# Patient Record
Sex: Female | Born: 1960 | Race: White | Hispanic: No | Marital: Married | Smoking: Never smoker
Health system: Southern US, Community
[De-identification: ages and names within clinical notes are randomized; demographics above are authoritative.]

## PROBLEM LIST (undated history)

## (undated) DIAGNOSIS — M1990 Unspecified osteoarthritis, unspecified site: Secondary | ICD-10-CM

## (undated) HISTORY — PX: BLEPHAROPLASTY: SUR158

## (undated) HISTORY — PX: TUBAL LIGATION: SHX77

## (undated) HISTORY — PX: KNEE ARTHROSCOPY: SUR90

## (undated) HISTORY — PX: FOOT SURGERY: SHX648

---

## 2009-06-26 DIAGNOSIS — J309 Allergic rhinitis, unspecified: Secondary | ICD-10-CM | POA: Insufficient documentation

## 2010-05-24 ENCOUNTER — Ambulatory Visit: Payer: Self-pay | Admitting: Unknown Physician Specialty

## 2011-04-03 ENCOUNTER — Ambulatory Visit: Payer: Self-pay | Admitting: Cardiovascular Disease

## 2014-04-02 ENCOUNTER — Ambulatory Visit: Payer: Self-pay | Admitting: Family Medicine

## 2014-06-23 LAB — CBC AND DIFFERENTIAL
HCT: 37 % (ref 36–46)
HEMOGLOBIN: 12.6 g/dL (ref 12.0–16.0)
Platelets: 277 10*3/uL (ref 150–399)
WBC: 6.5 10^3/mL

## 2014-06-23 LAB — HEPATIC FUNCTION PANEL
ALT: 25 U/L (ref 7–35)
AST: 27 U/L (ref 13–35)
Alkaline Phosphatase: 74 U/L (ref 25–125)
BILIRUBIN, TOTAL: 0.3 mg/dL

## 2014-06-23 LAB — BASIC METABOLIC PANEL
BUN: 13 mg/dL (ref 4–21)
CREATININE: 0.8 mg/dL (ref <=1.1)
GLUCOSE: 101 mg/dL
POTASSIUM: 5 mmol/L (ref 3.4–5.3)
SODIUM: 140 mmol/L (ref 137–147)

## 2014-06-23 LAB — LIPID PANEL
Cholesterol: 192 mg/dL (ref 0–200)
HDL: 55 mg/dL (ref 35–70)
LDL Cholesterol: 121 mg/dL
LDl/HDL Ratio: 2.2
Triglycerides: 82 mg/dL (ref 40–160)

## 2014-06-23 LAB — TSH: TSH: 2.89 u[IU]/mL (ref <=5.90)

## 2014-08-13 DIAGNOSIS — M5137 Other intervertebral disc degeneration, lumbosacral region: Secondary | ICD-10-CM | POA: Insufficient documentation

## 2014-08-13 DIAGNOSIS — E661 Drug-induced obesity: Secondary | ICD-10-CM | POA: Insufficient documentation

## 2014-09-28 ENCOUNTER — Ambulatory Visit (INDEPENDENT_AMBULATORY_CARE_PROVIDER_SITE_OTHER): Payer: BC Managed Care – PPO | Admitting: Family Medicine

## 2014-09-28 ENCOUNTER — Encounter: Payer: Self-pay | Admitting: Family Medicine

## 2014-09-28 VITALS — BP 132/80 | HR 68 | Temp 98.1°F | Resp 16 | Ht 67.0 in | Wt 282.0 lb

## 2014-09-28 DIAGNOSIS — Z0000 Encounter for general adult medical examination without abnormal findings: Secondary | ICD-10-CM

## 2014-09-28 DIAGNOSIS — M25562 Pain in left knee: Secondary | ICD-10-CM | POA: Diagnosis not present

## 2014-09-28 LAB — POCT URINALYSIS DIPSTICK
BILIRUBIN UA: NEGATIVE
Glucose, UA: NEGATIVE
KETONES UA: NEGATIVE
Leukocytes, UA: NEGATIVE
Nitrite, UA: NEGATIVE
PH UA: 6
Protein, UA: NEGATIVE
RBC UA: NEGATIVE
Spec Grav, UA: 1.015
Urobilinogen, UA: 0.2

## 2014-09-28 NOTE — Progress Notes (Signed)
Patient: Ariel Doyle, Female    DOB: 03-Nov-1960, 54 y.o.   MRN: [id number] Visit Date: 09/28/2014  Today's Provider: Wilhemena Durie, MD   Chief Complaint  Patient presents with  . Annual Exam  She sees Gyn for Breast exam and Pap. She is UTD. Subjective:   Ariel Doyle is a 54 y.o. female who presents today for her Subsequent Annual Wellness Visit. She feels well. She reports she is not exercising because of a torn Meniscus of her left knee. She reports she is sleeping well. She sees GYN for yearly breast and Pap.  Review of Systems  Constitutional: Negative.   HENT: Negative.   Eyes: Negative.   Respiratory: Negative.   Cardiovascular: Negative.   Gastrointestinal: Negative.   Endocrine: Negative.   Genitourinary: Negative.   Musculoskeletal: Positive for arthralgias.       Ongoing knee issue--needs surgery.  Skin: Negative.   Allergic/Immunologic: Negative.   Neurological: Negative.   Hematological: Negative.   Psychiatric/Behavioral: Negative.     Patient Active Problem List   Diagnosis Date Noted  . DDD (degenerative disc disease), lumbosacral 08/13/2014  . 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 Allergic rhinitis 06/26/2009    History   Social History  . Marital Status: Married    Spouse Name: N/A  . Number of Children: N/A  . Years of Education: N/A   Occupational History  . Not on file.   Social History Main Topics  . Smoking status: Not on file  . Smokeless tobacco: Not on file  . Alcohol Use: Not on file  . Drug Use: Not on file  . Sexual Activity: Not on file   Other Topics Concern  . Not on file   Social History Narrative  . No narrative on file    Past Surgical History  Procedure Laterality Date  . Tubal ligation    . Foot surgery      x 3    Her family history includes Breast cancer in her mother; Diabetes in her father; Heart attack in her father.    No outpatient prescriptions prior to visit.   No  facility-administered medications prior to visit.    Allergies no known allergies  Patient Care Team: Jerrol Banana., MD as PCP - General (Family Medicine)  Objective:   Vitals: There were no vitals filed for this visit.  Physical Exam  Constitutional: She is oriented to person, place, and time. She appears well-developed and well-nourished.  Obese. Ht67",Wt 282lbs,BMI 44.16  HENT:  Head: Normocephalic and atraumatic.  Right Ear: External ear normal.  Left Ear: External ear normal.  Nose: Nose normal.  Mouth/Throat: Oropharynx is clear and moist.  Eyes: Conjunctivae and EOM are normal. Pupils are equal, round, and reactive to light.  Neck: Normal range of motion. Neck supple.  Cardiovascular: Normal rate, regular rhythm, normal heart sounds and intact distal pulses.   Pulmonary/Chest: Effort normal and breath sounds normal.  Abdominal: Soft. Bowel sounds are normal.  Neurological: She is alert and oriented to person, place, and time. She has normal reflexes.  Skin: Skin is warm and dry.  Psychiatric: She has a normal mood and affect. Her behavior is normal. Judgment and thought content normal.    Activities of Daily Living No flowsheet data found.  Fall Risk Assessment No flowsheet data found.   Depression Screen No flowsheet data found.  Cognitive Testing - 6-CIT    Year: 0 4 points  Month: 0 3 points  Memorize "Pia Mau,  50, [place]"  Time (within 1 hour:) 0 3 points  Count backwards from 20: 0 2 4 points  Name months of year: 0 2 4 points  Repeat Address: 0 2 4 6 8 10  points   Total Score: 0/28  Interpretation : Normal (0-7) Abnormal (8-28)    Assessment & Plan:     Annual Wellness Visit--patient has no bad habits. Diet and exercise discussed at length for her overall wellness.  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 Theda Sers today  Obesity.--BMI today is 44.16. Iron exercise discussed at length. Patient says she  cannot exercise because of her knee. We discussed nonweightbearing exercises that are possible. Follow-up 1 year  Reviewed patient's Family Medical History Reviewed and updated list of patient's medical providers Assessment of cognitive impairment was done Assessed patient's functional ability Established a written schedule for health screening [place] Completed and Reviewed  Exercise Activities and Dietary recommendations Goals    None      Immunization History  Administered Date(s) Administered  . Tdap 02/22/2010    Health Maintenance  Topic Date Due  . HIV Screening  10/06/1977  . PAP SMEAR  10/06/1980  . MAMMOGRAM  10/06/2012  . COLONOSCOPY  10/06/2012  . INFLUENZA VACCINE  11/16/2014  . TETANUS/TDAP  02/23/2020      Discussed health benefits of physical activity, and encouraged her to engage in regular exercise appropriate for her age and condition.    Ariel Aschoff MD [facility] 09/28/2014 9:07 AM  ------------------------------------------------------------------------------------------------------------

## 2014-09-29 ENCOUNTER — Telehealth: Payer: Self-pay | Admitting: Emergency Medicine

## 2014-09-29 LAB — CBC WITH DIFFERENTIAL/PLATELET
BASOS: 0 %
Basophils Absolute: 0 10*3/uL (ref 0.0–0.2)
EOS (ABSOLUTE): 0.2 10*3/uL (ref 0.0–0.4)
Eos: 3 %
HEMATOCRIT: 39.8 % (ref 34.0–46.6)
Hemoglobin: 12.9 g/dL (ref 11.1–15.9)
Immature Grans (Abs): 0 10*3/uL (ref 0.0–0.1)
Immature Granulocytes: 0 %
LYMPHS ABS: 2.5 10*3/uL (ref 0.7–3.1)
LYMPHS: 40 %
MCH: 25.5 pg — ABNORMAL LOW (ref 26.6–33.0)
MCHC: 32.4 g/dL (ref 31.5–35.7)
MCV: 79 fL (ref 79–97)
MONOCYTES: 9 %
Monocytes Absolute: 0.6 10*3/uL (ref 0.1–0.9)
NEUTROS ABS: 3 10*3/uL (ref 1.4–7.0)
Neutrophils: 48 %
Platelets: 300 10*3/uL (ref 150–379)
RBC: 5.06 x10E6/uL (ref 3.77–5.28)
RDW: 15.3 % (ref 12.3–15.4)
WBC: 6.3 10*3/uL (ref 3.4–10.8)

## 2014-09-29 LAB — CMP14+EGFR
A/G RATIO: 2.1 (ref 1.1–2.5)
ALT: 40 IU/L — AB (ref 0–32)
AST: 23 IU/L (ref 0–40)
Albumin: 4.5 g/dL (ref 3.5–5.5)
Alkaline Phosphatase: 76 IU/L (ref 39–117)
BILIRUBIN TOTAL: 0.3 mg/dL (ref 0.0–1.2)
BUN/Creatinine Ratio: 16 (ref 9–23)
BUN: 14 mg/dL (ref 6–24)
CHLORIDE: 97 mmol/L (ref 97–108)
CO2: 26 mmol/L (ref 18–29)
Calcium: 9.7 mg/dL (ref 8.7–10.2)
Creatinine, Ser: 0.87 mg/dL (ref 0.57–1.00)
GFR calc Af Amer: 89 mL/min/{1.73_m2} (ref >59)
GFR calc non Af Amer: 77 mL/min/{1.73_m2} (ref >59)
GLOBULIN, TOTAL: 2.1 g/dL (ref 1.5–4.5)
GLUCOSE: 105 mg/dL — AB (ref 65–99)
Potassium: 4.5 mmol/L (ref 3.5–5.2)
Sodium: 141 mmol/L (ref 134–144)
TOTAL PROTEIN: 6.6 g/dL (ref 6.0–8.5)

## 2014-09-29 LAB — CHOLESTEROL, TOTAL: Cholesterol, Total: 234 mg/dL — ABNORMAL HIGH (ref 100–199)

## 2014-09-29 LAB — TSH: TSH: 2.88 u[IU]/mL (ref 0.450–4.500)

## 2014-09-29 NOTE — Telephone Encounter (Signed)
See other note

## 2014-10-06 ENCOUNTER — Telehealth: Payer: Self-pay

## 2014-10-06 NOTE — Telephone Encounter (Signed)
Check if patient has been informed

## 2014-10-08 ENCOUNTER — Encounter: Payer: Self-pay | Admitting: Family Medicine

## 2014-11-11 ENCOUNTER — Other Ambulatory Visit: Payer: Self-pay | Admitting: Specialist

## 2014-11-11 ENCOUNTER — Ambulatory Visit (HOSPITAL_COMMUNITY): Payer: BC Managed Care – PPO | Attending: Cardiology

## 2014-11-11 DIAGNOSIS — M79605 Pain in left leg: Secondary | ICD-10-CM | POA: Diagnosis not present

## 2014-11-11 DIAGNOSIS — M7989 Other specified soft tissue disorders: Secondary | ICD-10-CM | POA: Insufficient documentation

## 2014-11-11 DIAGNOSIS — E669 Obesity, unspecified: Secondary | ICD-10-CM | POA: Insufficient documentation

## 2015-03-30 DIAGNOSIS — M25562 Pain in left knee: Secondary | ICD-10-CM

## 2015-03-30 DIAGNOSIS — G8929 Other chronic pain: Secondary | ICD-10-CM | POA: Insufficient documentation

## 2015-06-04 ENCOUNTER — Ambulatory Visit (INDEPENDENT_AMBULATORY_CARE_PROVIDER_SITE_OTHER): Payer: BC Managed Care – PPO | Admitting: Family Medicine

## 2015-06-04 ENCOUNTER — Ambulatory Visit
Admission: RE | Admit: 2015-06-04 | Discharge: 2015-06-04 | Disposition: A | Discharge status: Home / Self Care | Payer: BC Managed Care – PPO | Source: Ambulatory Visit | Attending: Family Medicine | Admitting: Family Medicine

## 2015-06-04 ENCOUNTER — Encounter: Payer: Self-pay | Admitting: Family Medicine

## 2015-06-04 VITALS — BP 120/80 | HR 77 | Temp 97.8°F | Resp 20

## 2015-06-04 DIAGNOSIS — R05 Cough: Secondary | ICD-10-CM | POA: Diagnosis not present

## 2015-06-04 DIAGNOSIS — R059 Cough, unspecified: Secondary | ICD-10-CM

## 2015-06-04 DIAGNOSIS — R509 Fever, unspecified: Secondary | ICD-10-CM

## 2015-06-04 LAB — POCT INFLUENZA A/B
INFLUENZA B, POC: NEGATIVE
Influenza A, POC: NEGATIVE

## 2015-06-04 NOTE — Progress Notes (Signed)
   Subjective:    Patient ID: Ariel Doyle, female    DOB: [birth year], 55 y.o.   MRN: [id number]  HPI Fever: Patient presents with fevers up to 100 degrees this morning. She has had the fever for 4 days.  Symptoms have been gradually worsening. Symptoms associated with the fever include chills, fatigue, headache, otitis symptoms and URI symptoms, and patient denies abdominal pain.. Symptoms are worse at no particular time of day.  Symptoms have been gradually worsening since that time.  She has tried to alleviate the symptoms with acetaminophen with moderate relief.  Exposure to tobacco: no. Exposure to someone else at home w/similar symptoms:yes Exposure to someone else at daycare/school/work:yes. Patient reports she has been exposed with flu at school.   Review of Systems  Constitutional: Positive for chills, activity change and appetite change.  HENT: Positive for congestion, ear pain and rhinorrhea.   Respiratory: Positive for cough.   Musculoskeletal: Positive for myalgias.  Neurological: Positive for headaches.       Blood pressure 120/80, pulse 77, temperature 97.8 F (36.6 C), temperature source Oral, resp. rate 20, SpO2 96 %. Patient declined weight. Objective:   Physical Exam  Constitutional: She appears well-developed and well-nourished.  HENT:  Head: Normocephalic and atraumatic.  Right Ear: External ear normal.  Left Ear: External ear normal.  Nose: Nose normal.  Mouth/Throat: Oropharynx is clear and moist.  Cardiovascular: Normal rate, regular rhythm and normal heart sounds.   Pulmonary/Chest: Effort normal and breath sounds normal.      Assessment & Plan:  1. Other specified fever Flu negative.  Will check CXR.   - POCT Influenza A/B - DG Chest 2 View; Future Results for orders placed or performed in visit on 06/04/15  POCT Influenza A/B  Result Value Ref Range   Influenza A, POC Negative Negative   Influenza B, POC Negative Negative   2.  Cough Suspect viral. Continue OTC medication. Patient instructed to call back if condition worsens or does not improve.    - DG Chest 2 View; Future   Patient was seen and examined by Jerrell Belfast, MD, and note scribed by Lynford Humphrey, [place].   I have reviewed the document for accuracy and completeness and I agree with above. Jerrell Belfast, MD   Margarita Rana, MD

## 2015-06-07 ENCOUNTER — Encounter: Payer: Self-pay | Admitting: Family Medicine

## 2015-06-07 ENCOUNTER — Ambulatory Visit (INDEPENDENT_AMBULATORY_CARE_PROVIDER_SITE_OTHER): Payer: BC Managed Care – PPO | Admitting: Family Medicine

## 2015-06-07 VITALS — BP 150/90 | HR 90 | Temp 98.4°F | Resp 16

## 2015-06-07 DIAGNOSIS — J209 Acute bronchitis, unspecified: Secondary | ICD-10-CM

## 2015-06-07 MED ORDER — PREDNISONE 10 MG PO TABS: ORAL_TABLET | ORAL | Status: DC

## 2015-06-07 MED ORDER — AZITHROMYCIN 250 MG PO TABS: ORAL_TABLET | ORAL | Status: DC

## 2015-06-07 NOTE — Progress Notes (Signed)
   Subjective:    Patient ID: Ariel Doyle, female    DOB: 12-11-1960, 55 y.o.   MRN: [id number]  URI  Associated symptoms include congestion (shortness of breath), coughing, ear pain and wheezing. She has tried antihistamine, decongestant and inhaler use for the symptoms. The treatment provided mild relief.  Patient was seen of Friday. Patient reports that her symptoms are unchanged. Patient reports that her symptoms are worse at night. Patient's flu test was negative on Friday and chest x-ray was normal.   Review of Systems  HENT: Positive for congestion (shortness of breath) and ear pain.   Respiratory: Positive for cough and wheezing.       Blood pressure 150/90, pulse 90, temperature 98.4 F (36.9 C), temperature source Oral, resp. rate 16, SpO2 98 %. Objective:   Physical Exam  Constitutional: She appears well-developed and well-nourished.  HENT:  Head: Normocephalic and atraumatic.  Right Ear: External ear normal.  Left Ear: External ear normal.  Nose: Mucosal edema present.  Mouth/Throat: Oropharynx is clear and moist.  Cardiovascular: Normal rate, regular rhythm and normal heart sounds.   Pulmonary/Chest: Effort normal and breath sounds normal.       Assessment & Plan:  1. Acute bronchitis, unspecified organism New problem. Worsening. Patient started on azithromycin 250 mg as below and prednisone taper. Patient instructed to call back if condition worsens or does not improve.    - azithromycin (ZITHROMAX) 250 MG tablet; 2 tablets on day one and 1 tablet daily for 4 days  Dispense: 6 tablet; Refill: 0 And prednisone 6 day taper     Patient seen and examined by Dr. Jerrell Belfast, and note scribed by Ariel Doyle. Ariel Doyle, CMA.  I have reviewed the document for accuracy and completeness and I agree with above. Jerrell Belfast, MD   Margarita Rana, MD

## 2015-07-22 ENCOUNTER — Telehealth: Payer: Self-pay

## 2015-07-22 NOTE — Telephone Encounter (Signed)
lmtcb need to know when her last mammogram was and pap-aa

## 2016-04-18 ENCOUNTER — Ambulatory Visit
Admission: RE | Admit: 2016-04-18 | Discharge: 2016-04-18 | Disposition: A | Discharge status: Home / Self Care | Payer: BC Managed Care – PPO | Source: Ambulatory Visit | Attending: Unknown Physician Specialty | Admitting: Unknown Physician Specialty

## 2016-04-18 ENCOUNTER — Other Ambulatory Visit: Payer: Self-pay | Admitting: Unknown Physician Specialty

## 2016-04-18 DIAGNOSIS — J40 Bronchitis, not specified as acute or chronic: Secondary | ICD-10-CM | POA: Insufficient documentation

## 2016-04-18 DIAGNOSIS — R059 Cough, unspecified: Secondary | ICD-10-CM

## 2016-04-18 DIAGNOSIS — R05 Cough: Secondary | ICD-10-CM | POA: Diagnosis not present

## 2016-07-06 ENCOUNTER — Ambulatory Visit (INDEPENDENT_AMBULATORY_CARE_PROVIDER_SITE_OTHER): Payer: BC Managed Care – PPO | Admitting: Family Medicine

## 2016-07-06 VITALS — BP 132/74 | HR 80 | Temp 98.5°F | Resp 14

## 2016-07-06 DIAGNOSIS — Z01818 Encounter for other preprocedural examination: Secondary | ICD-10-CM | POA: Diagnosis not present

## 2016-07-06 NOTE — Progress Notes (Signed)
   Ariel Doyle  MRN: [id number] DOB: [birth year]  Subjective:  HPI  Patient is here for surgical clearance for partial knee replacement. Set for surgery is not set up yet. This is going to be done by Dr. Lyla Glassing. She is not smoking and she is not using alcohol. Not having any concerns about her surgery.  Patient Active Problem List   Diagnosis Date Noted  . Gonalgia 03/30/2015  . DDD (degenerative disc disease), lumbosacral 08/13/2014  . 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 Allergic rhinitis 06/26/2009    No past medical history on file.  Social History   Social History  . Marital status: Married    Spouse name: N/A  . Number of children: N/A  . Years of education: N/A   Occupational History  . Not on file.   Social History Main Topics  . Smoking status: Never Smoker  . Smokeless tobacco: Not on file  . Alcohol use No  . Drug use: No  . Sexual activity: Not on file   Other Topics Concern  . Not on file   Social History Narrative  . No narrative on file    Outpatient Encounter Prescriptions as of 07/06/2016  Medication Sig Note  . diclofenac (VOLTAREN) 75 MG EC tablet  06/07/2015: Received from: External Pharmacy  . [DISCONTINUED] azithromycin (ZITHROMAX) 250 MG tablet 2 tablets on day one and 1 tablet daily for 4 days   . [DISCONTINUED] predniSONE (DELTASONE) 10 MG tablet 6 po for 1 day and then 5 po for 1 day and then 4 po for 1 day and 3 po for 1 day and then 2 po for 1 day and then 1 po for 1 day.   . [DISCONTINUED] tobramycin-dexamethasone (TOBRADEX) ophthalmic solution  06/07/2015: Received from: External Pharmacy   No facility-administered encounter medications on file as of 07/06/2016.     No Known Allergies  Review of Systems  Constitutional: Negative.   Eyes: Negative.   Respiratory: Negative.   Cardiovascular: Negative.   Gastrointestinal: Negative.   Musculoskeletal: Positive for joint pain and myalgias.  Skin: Negative.   Endo/Heme/Allergies:  Negative.   Psychiatric/Behavioral: Negative.     Objective:  BP 132/74   Pulse 80   Temp 98.5 F (36.9 C)   Resp 14   LMP 08/22/2014 Comment: irregular  Physical Exam  Constitutional: She is oriented to person, place, and time and well-developed, well-nourished, and in no distress.  HENT:  Head: Normocephalic and atraumatic.  Right Ear: External ear normal.  Left Ear: External ear normal.  Nose: Nose normal.  Eyes: Conjunctivae are normal.  Neck: Neck supple.  Cardiovascular: Normal rate, regular rhythm and normal heart sounds.   Pulmonary/Chest: Effort normal and breath sounds normal.  Abdominal: Soft. Bowel sounds are normal.  Neurological: She is alert and oriented to person, place, and time. Gait normal. GCS score is 15.  Skin: Skin is warm and dry.  Psychiatric: Mood, memory, affect and judgment normal.    Assessment and Plan :  Knee Pain Pt medically cleared for ortho surgery.  Obesity OA  I have done the exam and reviewed the chart and it is accurate to the best of my knowledge. Development worker, community has been used and  any errors in dictation or transcription are unintentional. Miguel Aschoff M.D. [facility]

## 2016-07-07 ENCOUNTER — Telehealth: Payer: Self-pay

## 2016-07-07 NOTE — Telephone Encounter (Signed)
Dr Rosanna Randy let me know when you finish patient's office note and then I will fax everything to Dr Lyla Glassing for clearance for surgery. Thank you-aa

## 2016-07-11 NOTE — Telephone Encounter (Signed)
Noted thanks  -aa

## 2016-07-11 NOTE — Telephone Encounter (Signed)
done

## 2016-10-23 LAB — HM HEPATITIS C SCREENING LAB: HM Hepatitis Screen: NEGATIVE

## 2017-04-16 ENCOUNTER — Emergency Department: Payer: BC Managed Care – PPO

## 2017-04-16 ENCOUNTER — Emergency Department
Admission: EM | Admit: 2017-04-16 | Discharge: 2017-04-16 | Disposition: A | Discharge status: Home / Self Care | Payer: BC Managed Care – PPO | Attending: Emergency Medicine | Admitting: Emergency Medicine

## 2017-04-16 ENCOUNTER — Other Ambulatory Visit: Payer: Self-pay

## 2017-04-16 ENCOUNTER — Encounter: Payer: Self-pay | Admitting: Emergency Medicine

## 2017-04-16 DIAGNOSIS — Z791 Long term (current) use of non-steroidal anti-inflammatories (NSAID): Secondary | ICD-10-CM | POA: Insufficient documentation

## 2017-04-16 DIAGNOSIS — Z79899 Other long term (current) drug therapy: Secondary | ICD-10-CM | POA: Insufficient documentation

## 2017-04-16 DIAGNOSIS — R0602 Shortness of breath: Secondary | ICD-10-CM | POA: Diagnosis present

## 2017-04-16 DIAGNOSIS — J209 Acute bronchitis, unspecified: Secondary | ICD-10-CM | POA: Insufficient documentation

## 2017-04-16 HISTORY — DX: Unspecified osteoarthritis, unspecified site: M19.90

## 2017-04-16 LAB — CBC
HCT: 43.3 % (ref 35.0–47.0)
HEMOGLOBIN: 13.9 g/dL (ref 12.0–16.0)
MCH: 25.5 pg — AB (ref 26.0–34.0)
MCHC: 32.1 g/dL (ref 32.0–36.0)
MCV: 79.3 fL — AB (ref 80.0–100.0)
Platelets: 252 10*3/uL (ref 150–440)
RBC: 5.46 MIL/uL — AB (ref 3.80–5.20)
RDW: 14.7 % — ABNORMAL HIGH (ref 11.5–14.5)
WBC: 6.3 10*3/uL (ref 3.6–11.0)

## 2017-04-16 LAB — BASIC METABOLIC PANEL
Anion gap: 9 (ref 5–15)
BUN: 11 mg/dL (ref 6–20)
CHLORIDE: 99 mmol/L — AB (ref 101–111)
CO2: 26 mmol/L (ref 22–32)
Calcium: 8.8 mg/dL — ABNORMAL LOW (ref 8.9–10.3)
Creatinine, Ser: 0.8 mg/dL (ref 0.44–1.00)
GFR calc Af Amer: >60 mL/min (ref >60)
GFR calc non Af Amer: >60 mL/min (ref >60)
GLUCOSE: 131 mg/dL — AB (ref 65–99)
POTASSIUM: 4.3 mmol/L (ref 3.5–5.1)
Sodium: 134 mmol/L — ABNORMAL LOW (ref 135–145)

## 2017-04-16 LAB — INFLUENZA PANEL BY PCR (TYPE A & B)
Influenza A By PCR: NEGATIVE
Influenza B By PCR: NEGATIVE

## 2017-04-16 LAB — FIBRIN DERIVATIVES D-DIMER (ARMC ONLY): Fibrin derivatives D-dimer (ARMC): 651.6 ng/mL (FEU) — ABNORMAL HIGH (ref 0.00–499.00)

## 2017-04-16 MED ORDER — IOPAMIDOL (ISOVUE-370) INJECTION 76%
75.0000 mL | Freq: Once | INTRAVENOUS | Status: AC | PRN
  Administered 2017-04-16: 75 mL via INTRAVENOUS

## 2017-04-16 MED ORDER — IPRATROPIUM-ALBUTEROL 0.5-2.5 (3) MG/3ML IN SOLN
3.0000 mL | Freq: Once | RESPIRATORY_TRACT | Status: AC
  Administered 2017-04-16: 3 mL via RESPIRATORY_TRACT
  Filled 2017-04-16: qty 3

## 2017-04-16 MED ORDER — ALBUTEROL SULFATE HFA 108 (90 BASE) MCG/ACT IN AERS: 2.0000 | INHALATION_SPRAY | Freq: Four times a day (QID) | RESPIRATORY_TRACT | 2 refills | Status: DC | PRN

## 2017-04-16 MED ORDER — IPRATROPIUM-ALBUTEROL 0.5-2.5 (3) MG/3ML IN SOLN
3.0000 mL | Freq: Once | RESPIRATORY_TRACT | Status: AC
  Administered 2017-04-16: 3 mL via RESPIRATORY_TRACT

## 2017-04-16 MED ORDER — ALBUTEROL SULFATE (2.5 MG/3ML) 0.083% IN NEBU
2.5000 mg | INHALATION_SOLUTION | Freq: Once | RESPIRATORY_TRACT | Status: AC
  Administered 2017-04-16: 2.5 mg via RESPIRATORY_TRACT
  Filled 2017-04-16: qty 3

## 2017-04-16 MED ORDER — AMOXICILLIN-POT CLAVULANATE 875-125 MG PO TABS
1.0000 | ORAL_TABLET | Freq: Once | ORAL | Status: AC
  Administered 2017-04-16: 1 via ORAL
  Filled 2017-04-16: qty 1

## 2017-04-16 MED ORDER — BECLOMETHASONE DIPROP HFA 40 MCG/ACT IN AERB: 1.0000 | INHALATION_SPRAY | Freq: Two times a day (BID) | RESPIRATORY_TRACT | 0 refills | Status: DC

## 2017-04-16 MED ORDER — PREDNISONE 20 MG PO TABS
40.0000 mg | ORAL_TABLET | Freq: Once | ORAL | Status: AC
  Administered 2017-04-16: 40 mg via ORAL
  Filled 2017-04-16: qty 2

## 2017-04-16 MED ORDER — AMOXICILLIN-POT CLAVULANATE 875-125 MG PO TABS
1.0000 | ORAL_TABLET | Freq: Two times a day (BID) | ORAL | 0 refills | Status: DC
Start: 2017-04-16 — End: 2019-05-20

## 2017-04-16 MED ORDER — IPRATROPIUM-ALBUTEROL 0.5-2.5 (3) MG/3ML IN SOLN
RESPIRATORY_TRACT | Status: DC
Start: 2017-04-16 — End: 2017-04-16
  Filled 2017-04-16: qty 3

## 2017-04-16 MED ORDER — IPRATROPIUM-ALBUTEROL 0.5-2.5 (3) MG/3ML IN SOLN
RESPIRATORY_TRACT | Status: AC
  Filled 2017-04-16: qty 3

## 2017-04-16 MED ORDER — PREDNISONE 20 MG PO TABS: 40.0000 mg | ORAL_TABLET | Freq: Every day | ORAL | 0 refills | Status: DC

## 2017-04-16 NOTE — Discharge Instructions (Signed)
We believe that your symptoms are caused today by an exacerbation of bronchitis.  Please take the prescribed medications. Follow up with your doctor as recommended.  If you develop any new or worsening symptoms, including but not limited to fever, persistent vomiting, worsening shortness of breath, or other symptoms that concern you, please return to the Emergency Department immediately.

## 2017-04-16 NOTE — ED Provider Notes (Signed)
[facility] Emergency Department Provider Note ____________________________________________   First MD Initiated Contact with Patient 04/16/17 1149     (approximate)  I have reviewed the triage vital signs and the nursing notes.   HISTORY  Chief Complaint Shortness of Breath  HPI Ariel Doyle is a 56 y.o. female for evaluation of cough and shortness of breath  Seen today by her ear nose and throat doctor, they were concerned that she may have bronchitis but also thought since her lung sounds were very wheezy that she should come to the ER for evaluation to make sure she did have a "blood clot".  She reports that about once a year she gets a severe episode of bronchitis for the last few years, she has had to use steroids and inhalers for them.  Has not a be hospitalized.  Does not smoke does not carry a history of COPD.  No chest pain.  Since Thursday she has been coughing with a dry and occasionally productive cough with wheezing.  Shortness of breath when she is walking.  No swollen legs.  No history of any heart problems.  No chest pain.  No recent surgeries or long trips.  No history of blood clots.  Past Medical History:  Diagnosis Date  . Arthritis     Patient Active Problem List   Diagnosis Date Noted  . Gonalgia 03/30/2015  . DDD (degenerative disc disease), lumbosacral 08/13/2014  . 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 Allergic rhinitis 06/26/2009    Past Surgical History:  Procedure Laterality Date  . FOOT SURGERY     x 3  . FOOT SURGERY Right 10 years ago  . TUBAL LIGATION      Prior to Admission medications   Medication Sig Start Date End Date Taking? Authorizing Provider  diclofenac (VOLTAREN) 50 MG EC tablet Take 50 mg by mouth 2 (two) times daily. 04/04/17  Yes [provider]  lansoprazole (PREVACID) 15 MG capsule Take 15 mg by mouth daily.   Yes [provider]  sulfaSALAzine (AZULFIDINE) 500 MG tablet Take  1,000 mg by mouth 2 (two) times daily. 03/30/17  Yes [provider]  albuterol (PROVENTIL HFA;VENTOLIN HFA) 108 (90 Base) MCG/ACT inhaler Inhale 2 puffs into the lungs every 6 (six) hours as needed for wheezing or shortness of breath. 04/16/17   Delman Kitten, MD  amoxicillin-clavulanate (AUGMENTIN) 875-125 MG tablet Take 1 tablet by mouth 2 (two) times daily. 04/16/17   Delman Kitten, MD  beclomethasone (QVAR REDIHALER) 40 MCG/ACT inhaler Inhale 1 puff into the lungs 2 (two) times daily. 04/16/17   Delman Kitten, MD  predniSONE (DELTASONE) 20 MG tablet Take 2 tablets (40 mg total) by mouth daily. 04/16/17   Delman Kitten, MD    Allergies Patient has no known allergies.  Family History  Problem Relation Age of Onset  . Breast cancer Mother   . Diabetes Father   . Heart attack Father     Social History Social History   Tobacco Use  . Smoking status: Never Smoker  . Smokeless tobacco: Never Used  Substance Use Topics  . Alcohol use: No  . Drug use: No    Review of Systems Constitutional: No fever/chills Eyes: No visual changes. ENT: No sore throat. Cardiovascular: Denies chest pain. Respiratory: See HPI, at rest it does not feel too short of breath but when she is walking feels winded and wheezing Gastrointestinal: No abdominal pain.  No nausea, no vomiting.  No diarrhea.  No  constipation. Genitourinary: Negative for dysuria.  Denies pregnancy.  Musculoskeletal: Negative for back pain. Skin: Negative for rash. Neurological: Negative for headaches, focal weakness or numbness.    ____________________________________________   PHYSICAL EXAM:  VITAL SIGNS: ED Triage Vitals  Enc Vitals Group     BP 04/16/17 1117 (!) 166/93     Pulse Rate 04/16/17 1117 99     Resp 04/16/17 1117 (!) 24     Temp 04/16/17 1117 98.9 F (37.2 C)     Temp Source 04/16/17 1117 Oral     SpO2 04/16/17 1117 91 %     Weight 04/16/17 1118 280 lb (127 kg)     Height 04/16/17 1118 5\' 7"  (1.702 m)      Head Circumference --      Peak Flow --      Pain Score 04/16/17 1117 4     Pain Loc --      Pain Edu? --      Excl. in [place]? --     Constitutional: Alert and oriented. Well appearing and in no acute distress.  She and her husband both very pleasant. Eyes: Conjunctivae are normal. Head: Atraumatic. Nose: No congestion/rhinnorhea. Mouth/Throat: Mucous membranes are moist. Neck: No stridor.   Cardiovascular: Slightly tachycardic rate, regular rhythm. Grossly normal heart sounds.  Good peripheral circulation. Respiratory: Mild tachypnea, no accessory muscle use.  Speaks in full clear sentences.  Mild and extra Tory wheezing throughout all lobes without crackles.  No rhonchi. Gastrointestinal: Soft and nontender. No distention. Musculoskeletal: No lower extremity tenderness nor edema. Neurologic:  Normal speech and language. No gross focal neurologic deficits are appreciated.  Skin:  Skin is warm, dry and intact. No rash noted. Psychiatric: Mood and affect are normal. Speech and behavior are normal.  ____________________________________________   LABS (all labs ordered are listed, but only abnormal results are displayed)  Labs Reviewed  CBC - Abnormal; Notable for the following components:      Result Value   RBC 5.46 (*)    MCV 79.3 (*)    MCH 25.5 (*)    RDW 14.7 (*)    All other components within normal limits  BASIC METABOLIC PANEL - Abnormal; Notable for the following components:   Sodium 134 (*)    Chloride 99 (*)    Glucose, Bld 131 (*)    Calcium 8.8 (*)    All other components within normal limits  FIBRIN DERIVATIVES D-DIMER ([facility] ONLY) - Abnormal; Notable for the following components:   Fibrin derivatives D-dimer ([facility]) 651.60 (*)    All other components within normal limits  INFLUENZA PANEL BY PCR (TYPE A & B)   ____________________________________________  EKG  Reviewed interrupt me at 11:20 AM Heart rate 105 Curious 65 QTC 450 Sinus tachycardia  without any evidence of ischemia ____________________________________________  RADIOLOGY  Dg Chest 2 View  Result Date: 04/16/2017 CLINICAL DATA:  56 year old female with cough and congestion for 4 days. EXAM: CHEST  2 VIEW COMPARISON:  04/18/2016 chest radiographs and earlier. FINDINGS: Low lung volumes on 1 of the 2 PA views today. Stable lung volumes on the remaining 2 chest views today. Mediastinal contours remain normal. Visualized tracheal air column is within normal limits. No pneumothorax, pulmonary edema, pleural effusion or consolidation. Mild streaky opacity at the lung bases on the frontal view most resembles atelectasis. No confluent pulmonary opacity on the lateral. No acute osseous abnormality identified. Negative visible bowel gas pattern. IMPRESSION: Lower lung volumes on some of these images and  mild streaky bibasilar opacity favored to be atelectasis. No other acute cardiopulmonary abnormality. Electronically Signed   By: Genevie Ann M.D.   On: 04/16/2017 12:01   Ct Angio Chest Pe W And/or Wo Contrast  Result Date: 04/16/2017 CLINICAL DATA:  Hypoxia. Cough and congestion. Shortness of breath. EXAM: CT ANGIOGRAPHY CHEST WITH CONTRAST TECHNIQUE: Multidetector CT imaging of the chest was performed using the standard protocol during bolus administration of intravenous contrast. Multiplanar CT image reconstructions and MIPs were obtained to evaluate the vascular anatomy. CONTRAST:  81mL ISOVUE-370 IOPAMIDOL (ISOVUE-370) INJECTION 76% COMPARISON:  Two-view chest x-ray from the same day. FINDINGS: Cardiovascular: The heart size is normal. No significant pleural or pericardial effusion is present. Aorta and great vessels are within normal limits. Pulmonary artery opacification is excellent. There is no significant filling defect to suggest pulmonary embolus. Mediastinum/Nodes: No significant mediastinal or axillary adenopathy is present. Lungs/Pleura: Scattered ground-glass attenuation likely  represents atelectasis or less likely edema. There is no significant consolidation no peribronchial inflammatory changes are present. There are no significant effusions. No nodule or mass lesion is present. Upper Abdomen: There is diffuse fatty infiltration of the liver. No discrete lesion is present. No other focal lesions are present. There is no significant adenopathy. Musculoskeletal: Vertebral body heights and alignment are maintained. No focal lytic or blastic lesions are present. Review of the MIP images confirms the above findings. IMPRESSION: 1. No pulmonary embolus. 2. Mild scattered ground-glass attenuation likely reflecting atelectasis or less likely edema. 3. No focal airspace disease or inflammatory change otherwise. Electronically Signed   By: San Morelle M.D.   On: 04/16/2017 14:40     ____________________________________________   PROCEDURES  Procedure(s) performed: None  Procedures  Critical Care performed: No  ____________________________________________   INITIAL IMPRESSION / ASSESSMENT AND PLAN / ED COURSE  Pertinent labs & imaging results that were available during my care of the patient were reviewed by me and considered in my medical decision making (see chart for details).  Patient returns for evaluation of recent upper respiratory symptoms, cough congestion and mild wheezing.  Reports a history of bronchitis with similar presentations in the past.  She is alert and well-oriented with minimal increased work of breathing on examination.  She does exist demonstrate end expiratory wheezing.  No associated cardiac symptoms.  No evidence of CHF.  Low risk for PE, d-dimer sent but was positive.  CT angios negative for PE or pneumonia.  Vitals:   04/16/17 1400 04/16/17 1554  BP: 130/66 (!) 141/78  Pulse: 99 (!) 102  Resp:  20  Temp:    SpO2: 90% 93%   ----------------------------------------- 4:51 PM on  04/16/2017 -----------------------------------------  Patient able to ambulate from her room to the triage desk down the hallway with me in back.  Her oxygen saturation on return to the room was 93% on room air.  Normal respiratory pattern, able to speak in full sentences reports she feels much improved.  Her respirations have normalized, no evidence of ongoing increased work of breathing.  She is comfortable to plan for discharge, fill her prescriptions with careful PCP follow-up.  Husband taking her home, and I discussed with both of them very careful return precautions and treatment recommendations.      ____________________________________________   FINAL CLINICAL IMPRESSION(S) / ED DIAGNOSES  Final diagnoses:  Acute bronchitis, unspecified organism  Bronchospasm with bronchitis, acute      NEW MEDICATIONS STARTED DURING THIS VISIT:  This SmartLink is deprecated. Use AVSMEDLIST instead to display  the medication list for a patient.   Note:  This document was prepared using Dragon voice recognition software and may include unintentional dictation errors.     Delman Kitten, MD 04/16/17 [phone]

## 2017-04-16 NOTE — ED Notes (Signed)
Blue top sent. 

## 2017-04-16 NOTE — ED Triage Notes (Signed)
Pt sent over from PCP with low oxygen saturation (89-93%); pt reports cough, congestion since Thursday, thinks she has bronchitis. Husband reports symptoms have gotten worse every day. Pt with productive cough, has been taking ibuprofen so unknown fevers, pt feels "winded and out of breath".

## 2017-09-13 ENCOUNTER — Encounter: Payer: Self-pay | Admitting: Internal Medicine

## 2017-09-13 ENCOUNTER — Ambulatory Visit (INDEPENDENT_AMBULATORY_CARE_PROVIDER_SITE_OTHER): Payer: BC Managed Care – PPO | Admitting: Internal Medicine

## 2017-09-13 VITALS — BP 132/76 | HR 87 | Ht 67.0 in | Wt 293.0 lb

## 2017-09-13 DIAGNOSIS — J4550 Severe persistent asthma, uncomplicated: Secondary | ICD-10-CM

## 2017-09-13 MED ORDER — BUDESONIDE-FORMOTEROL FUMARATE 160-4.5 MCG/ACT IN AERO: 2.0000 | INHALATION_SPRAY | Freq: Two times a day (BID) | RESPIRATORY_TRACT | 12 refills | Status: DC

## 2017-09-13 NOTE — Progress Notes (Signed)
[facility] Consultation      Assessment and Plan:  Acute asthmatic bronchitis complicating chronic bronchitis. Acute cough. - Patient feeling somewhat better, currently on a prednisone taper, as well as Tessalon for cough. - Patient has a Rast testing for allergies tomorrow, I asked her to wait until 1 at least week after the prednisone is completed. - After the patient's Rast testing, I have asked her to start taking an antihistamine such as Allegra once daily. - I have changed her Qvar 40 to Symbicort 160, 2 puffs twice daily.  As her symptoms improve, can consider changing back to Qvar. -If symptoms persist or are recurrent, would consider bronchoscopy.    Date: 09/13/2017  MRN# [id number] Ariel Doyle 09-17-1960    Ariel Doyle is a 57 y.o. old female seen in consultation for chief complaint of:    Chief Complaint  Patient presents with  . Consult    referred by Dr. Pryor Ochoa for eval congestion (chest) cough and wheezing.  . Shortness of Breath    chest congestion, dry, whistling cough; wheezing     HPI:  She had a horrible case of bronchitis around New Years of this year. She was sent to the ED and had a CT chest, she was given albuterol treatments which helped somewhat.  She was treated with steroids, qvar, proair, abx, cough medicine including tessalon. She gets out of breath if she coughs. She eventually recovered from the bout after 2 weeks.  She did well, but her symptoms recurred about a week ago, she is once again taking qvar, 40 2 puffs twice per day, proventil about 4 times per day and now down to twice per day.  She denies reflux, she is taking prevacid.  She has occasional sinus drainage, she does not take anything for it.  She has 2 cats at home, they sleep in bed with her.  She has never been tested for allergies.   She has just started taking a 12 day course of prednisone.   Imaging personally reviewed; CT chest 04/16/17; Mild  apical mosaic pattern which may indicate pneumonitis or air trapping from small airway disease.    PMHX:   Past Medical History:  Diagnosis Date  . Arthritis    Surgical Hx:  Past Surgical History:  Procedure Laterality Date  . FOOT SURGERY     x 3  . FOOT SURGERY Right 10 years ago  . TUBAL LIGATION     Family Hx:  Family History  Problem Relation Age of Onset  . Breast cancer Mother   . Diabetes Father   . Heart attack Father    Social Hx:   Social History   Tobacco Use  . Smoking status: Never Smoker  . Smokeless tobacco: Never Used  Substance Use Topics  . Alcohol use: No  . Drug use: No   Medication:    Current Outpatient Medications:  .  albuterol (PROVENTIL HFA;VENTOLIN HFA) 108 (90 Base) MCG/ACT inhaler, Inhale 2 puffs into the lungs every 6 (six) hours as needed for wheezing or shortness of breath., Disp: 1 Inhaler, Rfl: 2 .  amoxicillin-clavulanate (AUGMENTIN) 875-125 MG tablet, Take 1 tablet by mouth 2 (two) times daily., Disp: 14 tablet, Rfl: 0 .  beclomethasone (QVAR REDIHALER) 40 MCG/ACT inhaler, Inhale 1 puff into the lungs 2 (two) times daily., Disp: 1 Inhaler, Rfl: 0 .  diclofenac (VOLTAREN) 50 MG EC tablet, Take 50 mg by mouth 2 (two) times daily., Disp: , Rfl:  .  lansoprazole (PREVACID) 15 MG capsule, Take 15 mg by mouth daily., Disp: , Rfl:  .  predniSONE (DELTASONE) 20 MG tablet, Take 2 tablets (40 mg total) by mouth daily., Disp: 10 tablet, Rfl: 0 .  sulfaSALAzine (AZULFIDINE) 500 MG tablet, Take 1,000 mg by mouth 2 (two) times daily., Disp: , Rfl:    Allergies:  Patient has no known allergies.  Review of Systems: Gen:  Denies  fever, sweats, chills HEENT: Denies blurred vision, double vision. bleeds, sore throat Cvc:  No dizziness, chest pain. Resp:   Denies cough or sputum production, shortness of breath Gi: Denies swallowing difficulty, stomach pain. Gu:  Denies bladder incontinence, burning urine Ext:   No Joint pain, stiffness. Skin:  No skin rash,  hives  Endoc:  No polyuria, polydipsia. Psych: No depression, insomnia. Other:  All other systems were reviewed with the patient and were negative other that what is mentioned in the HPI.   Physical Examination:   VS: BP 132/76 (BP Location: Left Arm, Cuff Size: Normal)   Pulse 87   Ht 5\' 7"  (1.702 m)   Wt 293 lb (132.9 kg)   LMP 08/22/2014 Comment: irregular  SpO2 95%   BMI 45.89 kg/m   General Appearance: No distress  Neuro:without focal findings,  speech normal,  HEENT: PERRLA, EOM intact.   Pulmonary: normal breath sounds, No wheezing.  CardiovascularNormal S1,S2.  No m/r/g.   Abdomen: Benign, Soft, non-tender. Renal:  No costovertebral tenderness  GU:  No performed at this time. Endoc: No evident thyromegaly, no signs of acromegaly. Skin:   warm, no rashes, no ecchymosis  Extremities: normal, no cyanosis, clubbing.  Other findings:    LABORATORY PANEL:   CBC No results for input(s): WBC, HGB, HCT, PLT in the last 168 hours. ------------------------------------------------------------------------------------------------------------------  Chemistries  No results for input(s): NA, K, CL, CO2, GLUCOSE, BUN, CREATININE, CALCIUM, MG, AST, ALT, ALKPHOS, BILITOT in the last 168 hours.  Invalid input(s): GFRCGP ------------------------------------------------------------------------------------------------------------------  Cardiac Enzymes No results for input(s): TROPONINI in the last 168 hours. ------------------------------------------------------------  RADIOLOGY:  No results found.     Thank  you for the consultation and for allowing [facility] to assist in the care of your patient. Our recommendations are noted above.  Please contact us if we can be of further service.   Marda Stalker, MD.  Board Certified in Internal Medicine, Pulmonary Medicine, [place], and Sleep Medicine.  [facility] Office Number: [phone]  Patricia Pesa, M.D.  Merton Border, M.D  09/13/2017

## 2017-09-13 NOTE — Patient Instructions (Signed)
Wait until at least 1 week after stopping prednisone to do the allergy test.  After your allergy blood test start taking an antihistamine.   Stop qvar, start symbicort 2 puffs twice per day, rinse mouth after use.

## 2017-10-04 ENCOUNTER — Telehealth: Payer: Self-pay | Admitting: Internal Medicine

## 2017-10-04 NOTE — Telephone Encounter (Addendum)
-----   Message from Clarisse Gouge sent at 09/14/2017  9:28 AM EDT ----- Regarding: recall  Patient wants after 4 not on Monday     lmov to schedule appt from recall in August with Dr. Juanell Fairly

## 2017-11-01 ENCOUNTER — Telehealth: Payer: Self-pay | Admitting: Internal Medicine

## 2017-11-01 NOTE — Telephone Encounter (Signed)
lmov x 2 to schedule Dr. Juanell Fairly appt from Recall .  Will try again at a later time.  Patient needs after 4 pm tue- fri

## 2017-11-23 NOTE — Telephone Encounter (Signed)
lmov to schedule ov with Dr. Juanell Fairly

## 2017-12-19 NOTE — Telephone Encounter (Signed)
lmov to schedule appt. Holding 9/9 at 430 pm

## 2017-12-25 ENCOUNTER — Encounter: Payer: Self-pay | Admitting: Internal Medicine

## 2017-12-25 NOTE — Telephone Encounter (Signed)
Mailed letter °

## 2018-01-22 LAB — TSH: TSH: 2.45 (ref <=5.90)

## 2018-05-09 ENCOUNTER — Other Ambulatory Visit: Payer: Self-pay | Admitting: Family Medicine

## 2018-05-09 DIAGNOSIS — Z20828 Contact with and (suspected) exposure to other viral communicable diseases: Secondary | ICD-10-CM

## 2018-05-09 MED ORDER — OSELTAMIVIR PHOSPHATE 75 MG PO CAPS: 75.0000 mg | ORAL_CAPSULE | Freq: Two times a day (BID) | ORAL | 0 refills | Status: DC

## 2019-05-20 ENCOUNTER — Ambulatory Visit: Payer: BC Managed Care – PPO | Admitting: Family Medicine

## 2019-05-20 ENCOUNTER — Encounter: Payer: Self-pay | Admitting: Family Medicine

## 2019-05-20 ENCOUNTER — Other Ambulatory Visit: Payer: Self-pay

## 2019-05-20 VITALS — BP 161/83 | HR 80 | Temp 96.9°F

## 2019-05-20 DIAGNOSIS — L4050 Arthropathic psoriasis, unspecified: Secondary | ICD-10-CM

## 2019-05-20 DIAGNOSIS — M791 Myalgia, unspecified site: Secondary | ICD-10-CM

## 2019-05-20 DIAGNOSIS — T50Z95A Adverse effect of other vaccines and biological substances, initial encounter: Secondary | ICD-10-CM | POA: Diagnosis not present

## 2019-05-20 NOTE — Progress Notes (Signed)
Patient: Ariel Doyle Female    DOB: 11-17-1960   59 y.o.   MRN: [id number] Visit Date: 05/21/2019  Today's Provider: Lavon Paganini, MD   Chief Complaint  Patient presents with  . Hip Pain    Bilateral; started three days ago.    Subjective:     Hip Pain  The incident occurred 3 to 5 days ago. There was no injury mechanism. The pain is present in the right hip, left leg, left hip and right leg. The pain is severe. The pain has been constant since onset. Associated symptoms include an inability to bear weight and muscle weakness. Pertinent negatives include no loss of motion, loss of sensation, numbness or tingling. She has tried NSAIDs for the symptoms. The treatment provided mild relief.   Pt also states she got her second Covid vaccine 05/12/2019  Seems to have come on gradually through the day, especially as she was cooking dinner on 1/30.  Got worse on 1/31.  Legs are sore to the touch.  Intermittent shooting pains down both legs.  No Known Allergies   Current Facility-Administered Medications:  .  ketorolac (TORADOL) injection 60 mg, 60 mg, Intramuscular, Once, Jesica Goheen, Dionne Bucy, MD  Current Outpatient Medications:  .  albuterol (PROVENTIL HFA;VENTOLIN HFA) 108 (90 Base) MCG/ACT inhaler, Inhale 2 puffs into the lungs every 6 (six) hours as needed for wheezing or shortness of breath., Disp: 1 Inhaler, Rfl: 2 .  diclofenac (VOLTAREN) 50 MG EC tablet, Take 50 mg by mouth 2 (two) times daily., Disp: , Rfl:  .  lansoprazole (PREVACID) 15 MG capsule, Take 15 mg by mouth daily., Disp: , Rfl:  .  sulfaSALAzine (AZULFIDINE) 500 MG tablet, Take 1,000 mg by mouth 2 (two) times daily., Disp: , Rfl:   Facility-Administered Medications Ordered in Other Visits:  .  orphenadrine (NORFLEX) injection 60 mg, 60 mg, Intramuscular, Q12H, Sable Feil, PA-C, 60 mg at 05/21/19 [id number]  Review of Systems  Respiratory: Negative.   Gastrointestinal: Negative.   Musculoskeletal:  Positive for arthralgias and gait problem. Negative for back pain, joint swelling, myalgias, neck pain and neck stiffness.  Neurological: Negative for dizziness, tingling, light-headedness, numbness and headaches.  Hematological: Negative for adenopathy. Does not bruise/bleed easily.    Social History   Tobacco Use  . Smoking status: Never Smoker  . Smokeless tobacco: Never Used  Substance Use Topics  . Alcohol use: No      Objective:   BP (!) 161/83 (BP Location: Left Arm, Patient Position: Sitting, Cuff Size: Large)   Pulse 80   Temp (!) 96.9 F (36.1 C) (Temporal)   LMP 08/22/2014 Comment: irregular Vitals:   05/20/19 0814  BP: (!) 161/83  Pulse: 80  Temp: (!) 96.9 F (36.1 C)  TempSrc: Temporal  There is no height or weight on file to calculate BMI.   Physical Exam Constitutional:      General: She is not in acute distress.    Appearance: Normal appearance. She is not diaphoretic.  HENT:     Head: Normocephalic and atraumatic.  Cardiovascular:     Rate and Rhythm: Normal rate and regular rhythm.     Heart sounds: Normal heart sounds. No murmur.  Pulmonary:     Effort: Pulmonary effort is normal. No respiratory distress.     Breath sounds: Normal breath sounds. No wheezing.  Abdominal:     General: There is no distension.     Palpations: Abdomen is soft.  Tenderness: There is no abdominal tenderness.  Musculoskeletal:     Right lower leg: No edema.     Left lower leg: No edema.     Comments: No joint swelling or effusions noted. No decreased strength or ROM in hips/knees.  No SI joint TTP.  Negative straight leg raise bilaterally.  Tenderness to palpation over large muscle groups of hips/thighs  Skin:    General: Skin is warm and dry.     Findings: No rash.  Neurological:     Mental Status: She is alert and oriented to person, place, and time. Mental status is at baseline.  Psychiatric:        Mood and Affect: Mood normal.        Behavior: Behavior  normal.      No results found for any visits on 05/20/19.     Assessment & Plan    1. Myalgia 2. Psoriatic arthritis ([place]) 3. Vaccination side effects, initial encounter -New problem -Has been building slightly since she got her second Covid vaccine -Suspect that she just has myalgias related to vaccine side effects -No signs of psoriatic arthritis flare or any joint involvement -We will give IM Toradol 30 mg x 1 today in the clinic -She can continue to use Voltaren - discussed conservative measures (RICE) -We discussed possibility of prednisone taper, but we do not want to decrease the efficacy have her vaccine -If she is still in significant pain later this week, would consider prednisone taper at that time -Discussed return precautions  Meds ordered this encounter  Medications  . ketorolac (TORADOL) injection 60 mg     Return in about 3 months (around 08/17/2019) for CPE.   The entirety of the information documented in the History of Present Illness, Review of Systems and Physical Exam were personally obtained by me. Portions of this information were initially documented by Ashley Royalty, CMA and reviewed by me for thoroughness and accuracy.    Marco Raper, Dionne Bucy, MD MPH [facility]

## 2019-05-20 NOTE — Patient Instructions (Addendum)
VAERS - to report vaccine side effects  Try CoQ10 (100-200mg  daily)  Continue Voltaren Use ice and heat Gentle stretching Gentle walking as tolerated Call Thursday if not improving and we'll consider prednisone

## 2019-05-21 ENCOUNTER — Emergency Department
Admission: EM | Admit: 2019-05-21 | Discharge: 2019-05-21 | Disposition: A | Discharge status: Home / Self Care | Payer: BC Managed Care – PPO | Attending: Emergency Medicine | Admitting: Emergency Medicine

## 2019-05-21 ENCOUNTER — Telehealth: Payer: Self-pay

## 2019-05-21 ENCOUNTER — Emergency Department: Payer: BC Managed Care – PPO

## 2019-05-21 ENCOUNTER — Other Ambulatory Visit: Payer: Self-pay

## 2019-05-21 ENCOUNTER — Encounter: Payer: Self-pay | Admitting: Emergency Medicine

## 2019-05-21 DIAGNOSIS — R7309 Other abnormal glucose: Secondary | ICD-10-CM

## 2019-05-21 DIAGNOSIS — M25552 Pain in left hip: Secondary | ICD-10-CM | POA: Insufficient documentation

## 2019-05-21 DIAGNOSIS — M25551 Pain in right hip: Secondary | ICD-10-CM | POA: Diagnosis not present

## 2019-05-21 DIAGNOSIS — L4050 Arthropathic psoriasis, unspecified: Secondary | ICD-10-CM | POA: Insufficient documentation

## 2019-05-21 DIAGNOSIS — Z79899 Other long term (current) drug therapy: Secondary | ICD-10-CM | POA: Insufficient documentation

## 2019-05-21 LAB — CBC WITH DIFFERENTIAL/PLATELET
Abs Immature Granulocytes: 0.03 10*3/uL (ref 0.00–0.07)
Basophils Absolute: 0 10*3/uL (ref 0.0–0.1)
Basophils Relative: 0 %
Eosinophils Absolute: 0.1 10*3/uL (ref 0.0–0.5)
Eosinophils Relative: 2 %
HCT: 41.8 % (ref 36.0–46.0)
Hemoglobin: 13 g/dL (ref 12.0–15.0)
Immature Granulocytes: 0 %
Lymphocytes Relative: 26 %
Lymphs Abs: 2.1 10*3/uL (ref 0.7–4.0)
MCH: 26.4 pg (ref 26.0–34.0)
MCHC: 31.1 g/dL (ref 30.0–36.0)
MCV: 84.8 fL (ref 80.0–100.0)
Monocytes Absolute: 0.5 10*3/uL (ref 0.1–1.0)
Monocytes Relative: 7 %
Neutro Abs: 5 10*3/uL (ref 1.7–7.7)
Neutrophils Relative %: 65 %
Platelets: 256 10*3/uL (ref 150–400)
RBC: 4.93 MIL/uL (ref 3.87–5.11)
RDW: 13.8 % (ref 11.5–15.5)
WBC: 7.8 10*3/uL (ref 4.0–10.5)
nRBC: 0 % (ref 0.0–0.2)

## 2019-05-21 LAB — BASIC METABOLIC PANEL
Anion gap: 8 (ref 5–15)
BUN: 14 mg/dL (ref 6–20)
CO2: 29 mmol/L (ref 22–32)
Calcium: 9.2 mg/dL (ref 8.9–10.3)
Chloride: 100 mmol/L (ref 98–111)
Creatinine, Ser: 0.78 mg/dL (ref 0.44–1.00)
GFR calc Af Amer: >60 mL/min (ref >60)
GFR calc non Af Amer: >60 mL/min (ref >60)
Glucose, Bld: 156 mg/dL — ABNORMAL HIGH (ref 70–99)
Potassium: 4.4 mmol/L (ref 3.5–5.1)
Sodium: 137 mmol/L (ref 135–145)

## 2019-05-21 LAB — SEDIMENTATION RATE: Sed Rate: 30 mm/hr (ref 0–30)

## 2019-05-21 LAB — LACTIC ACID, PLASMA: Lactic Acid, Venous: 1.7 mmol/L (ref 0.5–1.9)

## 2019-05-21 MED ORDER — KETOROLAC TROMETHAMINE 60 MG/2ML IM SOLN
60.0000 mg | Freq: Once | INTRAMUSCULAR | Status: AC
  Administered 2019-05-20: 30 mg via INTRAMUSCULAR

## 2019-05-21 MED ORDER — HYDROMORPHONE HCL 1 MG/ML IJ SOLN
1.0000 mg | Freq: Once | INTRAMUSCULAR | Status: AC
  Administered 2019-05-21: 1 mg via INTRAMUSCULAR
  Filled 2019-05-21: qty 1

## 2019-05-21 MED ORDER — CYCLOBENZAPRINE HCL 10 MG PO TABS: 10.0000 mg | ORAL_TABLET | Freq: Three times a day (TID) | ORAL | 0 refills | Status: DC | PRN

## 2019-05-21 MED ORDER — ORPHENADRINE CITRATE 30 MG/ML IJ SOLN
60.0000 mg | Freq: Two times a day (BID) | INTRAMUSCULAR | Status: DC
  Administered 2019-05-21: 60 mg via INTRAMUSCULAR
  Filled 2019-05-21: qty 2

## 2019-05-21 MED ORDER — TRAMADOL HCL 50 MG PO TABS: 50.0000 mg | ORAL_TABLET | Freq: Four times a day (QID) | ORAL | 0 refills | Status: DC | PRN

## 2019-05-21 NOTE — Telephone Encounter (Signed)
FYI: Ariel Doyle reports that he took his wife to the ER this morning at 6am. Patient is still at the ER waiting to have more test done.

## 2019-05-21 NOTE — ED Triage Notes (Signed)
Pt to triage via w/c, mask in place with no distress noted; pt reports lower back pain radiating into hips & legs since receiving COVID vaccine on Saturday; denies any accomp symptoms; st received toradol injection yesterday with no relief; st pain increases with wt bearing

## 2019-05-21 NOTE — Discharge Instructions (Signed)
Follow discharge care instruction take medication as directed.  Advised to follow-up with PCP secondary to elevated glucose with no history of diabetes.

## 2019-05-21 NOTE — ED Provider Notes (Signed)
[facility] Emergency Department Provider Note   ____________________________________________   First MD Initiated Contact with Patient 05/21/19 [phone]     (approximate)  I have reviewed the triage vital signs and the nursing notes.   HISTORY  Chief Complaint Back Pain    HPI Ariel Doyle is a 59 y.o. female patient complain of bilateral hip pain that radiates to just above her knees.  Patient state no provocative complaint.  Patient states she saw her family doctor yesterday and thought she might be having a reaction from second Covidshot.  Patient denies any other joint pain.  Patient denies bladder or bowel dysfunction.  Patient state pain started today because while she was standing preparing a meal for her family.  Patient state pain increased with lying down.  Patient was given a Toradol shot yesterday with no noticeable relief.   Past Medical History:  Diagnosis Date  . Arthritis     Patient Active Problem List   Diagnosis Date Noted  . Psoriatic arthritis ([place]) 05/21/2019  . Chronic pain of left knee 03/30/2015  . DDD (degenerative disc disease), lumbosacral 08/13/2014  . 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 Allergic rhinitis 06/26/2009    Past Surgical History:  Procedure Laterality Date  . FOOT SURGERY     x 3  . FOOT SURGERY Right 10 years ago  . TUBAL LIGATION      Prior to Admission medications   Medication Sig Start Date End Date Taking? Authorizing Provider  albuterol (PROVENTIL HFA;VENTOLIN HFA) 108 (90 Base) MCG/ACT inhaler Inhale 2 puffs into the lungs every 6 (six) hours as needed for wheezing or shortness of breath. 04/16/17   Delman Kitten, MD  cyclobenzaprine (FLEXERIL) 10 MG tablet Take 1 tablet (10 mg total) by mouth 3 (three) times daily as needed. 05/21/19   Sable Feil, PA-C  diclofenac (VOLTAREN) 50 MG EC tablet Take 50 mg by mouth 2 (two) times daily. 04/04/17   [provider]  lansoprazole (PREVACID)  15 MG capsule Take 15 mg by mouth daily.    [provider]  sulfaSALAzine (AZULFIDINE) 500 MG tablet Take 1,000 mg by mouth 2 (two) times daily. 03/30/17   [provider]  traMADol (ULTRAM) 50 MG tablet Take 1 tablet (50 mg total) by mouth every 6 (six) hours as needed. 05/21/19 05/20/20  Sable Feil, PA-C    Allergies Patient has no known allergies.  Family History  Problem Relation Age of Onset  . Breast cancer Mother   . Diabetes Father   . Heart attack Father     Social History Social History   Tobacco Use  . Smoking status: Never Smoker  . Smokeless tobacco: Never Used  Substance Use Topics  . Alcohol use: No  . Drug use: No    Review of Systems Constitutional: No fever/chills Eyes: No visual changes. ENT: No sore throat. Cardiovascular: Denies chest pain. Respiratory: Denies shortness of breath. Gastrointestinal: No abdominal pain.  No nausea, no vomiting.  No diarrhea.  No constipation. Genitourinary: Negative for dysuria. Musculoskeletal: Bilateral hip pain.   Skin: Negative for rash. Neurological: Negative for headaches, focal weakness or numbness.   ____________________________________________   PHYSICAL EXAM:  VITAL SIGNS: ED Triage Vitals [05/21/19 0647]  Enc Vitals Group     BP (!) 165/79     Pulse Rate 85     Resp 18     Temp 98.3 F (36.8 C)     Temp Source Oral  SpO2 97 %     Weight 289 lb (131.1 kg)     Height 5\' 7"  (1.702 m)     Head Circumference      Peak Flow      Pain Score 5     Pain Loc      Pain Edu?      Excl. in [place]?     Constitutional: Alert and oriented. Well appearing and in no acute distress.  Morbid obesity. Cardiovascular: Normal rate, regular rhythm. Grossly normal heart sounds.  Good peripheral circulation.  Elevated blood pressure Respiratory: Normal respiratory effort.  No retractions. Lungs CTAB. Musculoskeletal: No obvious spinal deformity.  Patient has increased pain in the supine  position.  No leg length discrepancy.  Patient is moderate guarding palpation of the greater trochanter.  No lower extremity tenderness nor edema.  No joint effusions. Neurologic:  Normal speech and language. No gross focal neurologic deficits are appreciated. No gait instability. Skin:  Skin is warm, dry and intact. No rash noted. Psychiatric: Mood and affect are normal. Speech and behavior are normal.  ____________________________________________   LABS (all labs ordered are listed, but only abnormal results are displayed)  Labs Reviewed  BASIC METABOLIC PANEL - Abnormal; Notable for the following components:      Result Value   Glucose, Bld 156 (*)    All other components within normal limits  LACTIC ACID, PLASMA  CBC WITH DIFFERENTIAL/PLATELET  SEDIMENTATION RATE  LACTIC ACID, PLASMA   ____________________________________________  EKG   ____________________________________________  RADIOLOGY  ED MD interpretation:    Official radiology report(s): DG HIP UNILAT WITH PELVIS 2-3 VIEWS LEFT  Result Date: 05/21/2019 CLINICAL DATA:  Left hip pain without known injury. EXAM: DG HIP (WITH OR WITHOUT PELVIS) 2-3V LEFT COMPARISON:  None. FINDINGS: There is no evidence of hip fracture or dislocation. There is no evidence of arthropathy or other focal bone abnormality. IMPRESSION: Negative. Electronically Signed   By: Marijo Conception M.D.   On: 05/21/2019 08:57   DG HIP UNILAT WITH PELVIS 2-3 VIEWS RIGHT  Result Date: 05/21/2019 CLINICAL DATA:  Right hip pain without known injury. EXAM: DG HIP (WITH OR WITHOUT PELVIS) 2-3V RIGHT COMPARISON:  None. FINDINGS: There is no evidence of hip fracture or dislocation. There is no evidence of arthropathy or other focal bone abnormality. IMPRESSION: Negative. Electronically Signed   By: Marijo Conception M.D.   On: 05/21/2019 08:57    ____________________________________________   PROCEDURES  Procedure(s) performed (including Critical  Care):  Procedures   ____________________________________________   INITIAL IMPRESSION / ASSESSMENT AND PLAN / ED COURSE  As part of my medical decision making, I reviewed the following data within the [place]     Patient presents with bilateral hip pain that radiates to her knees.  Patient also states low back pain.  Patient physical exam is unremarkable except for complaint.  Discussed negative x-ray results.  Discussed lab results showing on an elevation in glucose.  Patient has no history of diabetes.  Patient blood pressure did decrease to 149/72 status post pain medication and muscle relaxers.  Patient pain decreased to a 6/10.  Patient given discharge care instruction and advised to follow-up with her PCP.   LEWANA MINUCCI was evaluated in Emergency Department on 05/21/2019 for the symptoms described in the history of present illness. She was evaluated in the context of the global COVID-19 pandemic, which necessitated consideration that the patient might be at risk for infection with  the SARS-CoV-2 virus that causes COVID-19. Institutional protocols and algorithms that pertain to the evaluation of patients at risk for COVID-19 are in a state of rapid change based on information released by regulatory bodies including the CDC and federal and state organizations. These policies and algorithms were followed during the patient's care in the ED.       ____________________________________________   FINAL CLINICAL IMPRESSION(S) / ED DIAGNOSES  Final diagnoses:  Hip pain, bilateral  Elevated glucose     ED Discharge Orders         Ordered    traMADol (ULTRAM) 50 MG tablet  Every 6 hours PRN     05/21/19 1144    cyclobenzaprine (FLEXERIL) 10 MG tablet  3 times daily PRN     05/21/19 1144           Note:  This document was prepared using Dragon voice recognition software and may include unintentional dictation errors.    Sable Feil, PA-C 05/21/19  1155    Blake Divine, MD 05/22/19 1155

## 2019-05-21 NOTE — Telephone Encounter (Signed)
Noted. Sorry to hear that

## 2019-05-21 NOTE — ED Notes (Signed)
Signature pad not working. Pt verbalizes understanding of d/c instructions, no further questions or concerns at this time. Pt in NAD at this time.

## 2019-05-23 ENCOUNTER — Ambulatory Visit: Payer: BC Managed Care – PPO | Admitting: Family Medicine

## 2019-05-23 ENCOUNTER — Other Ambulatory Visit: Payer: Self-pay

## 2019-05-23 ENCOUNTER — Encounter: Payer: Self-pay | Admitting: Family Medicine

## 2019-05-23 VITALS — BP 144/78 | HR 86 | Temp 96.1°F

## 2019-05-23 DIAGNOSIS — M791 Myalgia, unspecified site: Secondary | ICD-10-CM

## 2019-05-23 DIAGNOSIS — R590 Localized enlarged lymph nodes: Secondary | ICD-10-CM

## 2019-05-23 MED ORDER — PREDNISONE 20 MG PO TABS: ORAL_TABLET | ORAL | 0 refills | Status: DC

## 2019-05-23 NOTE — Progress Notes (Signed)
Patient: Ariel Doyle Female    DOB: Mar 14, 1961   59 y.o.   MRN: [id number] Visit Date: 05/23/2019  Today's Provider: Lavon Paganini, MD   Chief Complaint  Patient presents with  . Follow-up    ER on 05/22/2019  . Hip Injury   Subjective:     HPI    Follow up ER visit  Patient was seen in ER for Hip pain on 05/21/2019. She was treated for hip pain - unclear etiology. Treatment for this included flexeril and tramadol prn. She had negative hip XRay, Lactic acid, CMP, CBC, ESR. She reports excellent compliance with treatment. She reports this condition is Worse. She continues to have pain all along her hips and thighs.  Feels at times like her legs are wet noodles.  Feels pulling/burning pain in thighs when standing up. ------------------------------------------------------------------------------------    No Known Allergies   Current Outpatient Medications:  .  albuterol (PROVENTIL HFA;VENTOLIN HFA) 108 (90 Base) MCG/ACT inhaler, Inhale 2 puffs into the lungs every 6 (six) hours as needed for wheezing or shortness of breath., Disp: 1 Inhaler, Rfl: 2 .  cyclobenzaprine (FLEXERIL) 10 MG tablet, Take 1 tablet (10 mg total) by mouth 3 (three) times daily as needed., Disp: 15 tablet, Rfl: 0 .  diclofenac (VOLTAREN) 50 MG EC tablet, Take 50 mg by mouth 2 (two) times daily., Disp: , Rfl:  .  lansoprazole (PREVACID) 15 MG capsule, Take 15 mg by mouth daily., Disp: , Rfl:  .  sulfaSALAzine (AZULFIDINE) 500 MG tablet, Take 1,000 mg by mouth 2 (two) times daily., Disp: , Rfl:  .  traMADol (ULTRAM) 50 MG tablet, Take 1 tablet (50 mg total) by mouth every 6 (six) hours as needed., Disp: 20 tablet, Rfl: 0  Review of Systems  Constitutional: Negative.   Gastrointestinal: Positive for abdominal pain. Negative for abdominal distention, anal bleeding, blood in stool, constipation, diarrhea, nausea, rectal pain and vomiting.  Genitourinary: Positive for pelvic pain.    Musculoskeletal: Positive for arthralgias and gait problem. Negative for back pain, joint swelling, myalgias, neck pain and neck stiffness.  Neurological: Negative for dizziness, light-headedness and headaches.    Social History   Tobacco Use  . Smoking status: Never Smoker  . Smokeless tobacco: Never Used  Substance Use Topics  . Alcohol use: No      Objective:   BP (!) 144/78 (BP Location: Right Arm, Patient Position: Sitting, Cuff Size: Large)   Pulse 86   Temp (!) 96.1 F (35.6 C) (Temporal)   LMP 08/22/2014 Comment: irregular  SpO2 99%  Vitals:   05/23/19 1053  BP: (!) 144/78  Pulse: 86  Temp: (!) 96.1 F (35.6 C)  TempSrc: Temporal  SpO2: 99%  There is no height or weight on file to calculate BMI.   Physical Exam Vitals reviewed.  Constitutional:      General: She is not in acute distress.    Appearance: Normal appearance. She is not diaphoretic.     Comments: Appears uncomfortable  HENT:     Head: Normocephalic and atraumatic.  Eyes:     General: No scleral icterus.    Conjunctiva/sclera: Conjunctivae normal.  Neck:     Comments: 1 mobile supraclavicular enlarged lymph node noted on L side. No axillary LAD Cardiovascular:     Rate and Rhythm: Normal rate and regular rhythm.     Pulses: Normal pulses.     Heart sounds: Normal heart sounds. No murmur.  Pulmonary:  Effort: Pulmonary effort is normal. No respiratory distress.     Breath sounds: Normal breath sounds. No wheezing.  Abdominal:     General: There is no distension.     Palpations: Abdomen is soft.     Tenderness: There is no abdominal tenderness.  Musculoskeletal:     Right lower leg: No edema.     Left lower leg: No edema.     Comments: No bony TTP. Diffuse muscular TTP of thighs and bilateral hips  Lymphadenopathy:     Cervical: No cervical adenopathy.  Skin:    General: Skin is warm and dry.     Findings: No rash.  Neurological:     Mental Status: She is alert and oriented to  person, place, and time. Mental status is at baseline.  Psychiatric:        Behavior: Behavior normal.        Thought Content: Thought content normal.      No results found for any visits on 05/23/19.     Assessment & Plan    1. Myalgia - ongoing  - suspect COVID19 vaccine side effect - reassured patient that Xray was normal with no evident fracture -Reassured her that she does not have any lactic acidosis, AKI or electrolyte abnormality -Reassured her that based on white blood cell count being normal, is very unlikely that she has a current infection -Will check CK to ensure no mild rhabdomyolysis, but I am reassured by her normal creatinine on recent labs -Will start prednisone burst and taper to see if this can help -Did encourage her to touch base with her rheumatologist -No NSAIDs while taking prednisone -May continue tramadol and Flexeril as needed - CK (Creatine Kinase)  2. Supraclavicular lymphadenopathy -Noted incidentally on exam -CBC recently normal -This is likely reactive in the setting of recent vaccination in the same arm -We will continue to monitor    Meds ordered this encounter  Medications  . predniSONE (DELTASONE) 20 MG tablet    Sig: Take 76m PO daily x 2 days, then46mPO daily x 2 days, then 2066mO daily x 3 days    Dispense:  13 tablet    Refill:  0     Return in about 3 days (around 05/26/2019) for myalgia f/u.   The entirety of the information documented in the History of Present Illness, Review of Systems and Physical Exam were personally obtained by me. Portions of this information were initially documented by LauAshley RoyaltyMA and reviewed by me for thoroughness and accuracy.    Kali Deadwyler, AngDionne BucyD MPH [facility]

## 2019-05-23 NOTE — Patient Instructions (Signed)

## 2019-05-24 LAB — CK: Total CK: 76 U/L (ref 32–182)

## 2019-05-25 ENCOUNTER — Emergency Department
Admission: EM | Admit: 2019-05-25 | Discharge: 2019-05-25 | Disposition: A | Discharge status: Home / Self Care | Payer: BC Managed Care – PPO | Attending: Emergency Medicine | Admitting: Emergency Medicine

## 2019-05-25 ENCOUNTER — Emergency Department: Payer: BC Managed Care – PPO

## 2019-05-25 ENCOUNTER — Other Ambulatory Visit: Payer: Self-pay

## 2019-05-25 DIAGNOSIS — M4807 Spinal stenosis, lumbosacral region: Secondary | ICD-10-CM

## 2019-05-25 DIAGNOSIS — M48061 Spinal stenosis, lumbar region without neurogenic claudication: Secondary | ICD-10-CM | POA: Insufficient documentation

## 2019-05-25 DIAGNOSIS — M79606 Pain in leg, unspecified: Secondary | ICD-10-CM | POA: Diagnosis not present

## 2019-05-25 DIAGNOSIS — M5126 Other intervertebral disc displacement, lumbar region: Secondary | ICD-10-CM | POA: Diagnosis not present

## 2019-05-25 DIAGNOSIS — R109 Unspecified abdominal pain: Secondary | ICD-10-CM | POA: Insufficient documentation

## 2019-05-25 DIAGNOSIS — M25559 Pain in unspecified hip: Secondary | ICD-10-CM | POA: Diagnosis present

## 2019-05-25 LAB — COMPREHENSIVE METABOLIC PANEL
ALT: 68 U/L — ABNORMAL HIGH (ref 0–44)
AST: 72 U/L — ABNORMAL HIGH (ref 15–41)
Albumin: 4.1 g/dL (ref 3.5–5.0)
Alkaline Phosphatase: 87 U/L (ref 38–126)
Anion gap: 11 (ref 5–15)
BUN: 16 mg/dL (ref 6–20)
CO2: 29 mmol/L (ref 22–32)
Calcium: 9.2 mg/dL (ref 8.9–10.3)
Chloride: 96 mmol/L — ABNORMAL LOW (ref 98–111)
Creatinine, Ser: 0.9 mg/dL (ref 0.44–1.00)
GFR calc Af Amer: >60 mL/min (ref >60)
GFR calc non Af Amer: >60 mL/min (ref >60)
Glucose, Bld: 288 mg/dL — ABNORMAL HIGH (ref 70–99)
Potassium: 4.5 mmol/L (ref 3.5–5.1)
Sodium: 136 mmol/L (ref 135–145)
Total Bilirubin: 0.4 mg/dL (ref 0.3–1.2)
Total Protein: 7.7 g/dL (ref 6.5–8.1)

## 2019-05-25 LAB — URINALYSIS, COMPLETE (UACMP) WITH MICROSCOPIC
Bilirubin Urine: NEGATIVE
Glucose, UA: NEGATIVE mg/dL
Hgb urine dipstick: NEGATIVE
Ketones, ur: NEGATIVE mg/dL
Leukocytes,Ua: NEGATIVE
Nitrite: NEGATIVE
Protein, ur: NEGATIVE mg/dL
Specific Gravity, Urine: 1.017 (ref 1.005–1.030)
pH: 6 (ref 5.0–8.0)

## 2019-05-25 LAB — CBC
HCT: 42.7 % (ref 36.0–46.0)
Hemoglobin: 13.3 g/dL (ref 12.0–15.0)
MCH: 26.7 pg (ref 26.0–34.0)
MCHC: 31.1 g/dL (ref 30.0–36.0)
MCV: 85.7 fL (ref 80.0–100.0)
Platelets: 277 10*3/uL (ref 150–400)
RBC: 4.98 MIL/uL (ref 3.87–5.11)
RDW: 13.6 % (ref 11.5–15.5)
WBC: 8.1 10*3/uL (ref 4.0–10.5)
nRBC: 0 % (ref 0.0–0.2)

## 2019-05-25 LAB — LIPASE, BLOOD: Lipase: 22 U/L (ref 11–51)

## 2019-05-25 MED ORDER — HYDROMORPHONE HCL 1 MG/ML IJ SOLN
1.0000 mg | Freq: Once | INTRAMUSCULAR | Status: AC
  Administered 2019-05-25: 1 mg via INTRAVENOUS
  Filled 2019-05-25: qty 1

## 2019-05-25 MED ORDER — SODIUM CHLORIDE 0.9% FLUSH
3.0000 mL | Freq: Once | INTRAVENOUS | Status: AC
  Administered 2019-05-25: 3 mL via INTRAVENOUS

## 2019-05-25 MED ORDER — OXYCODONE-ACETAMINOPHEN 7.5-325 MG PO TABS: 1.0000 | ORAL_TABLET | ORAL | 0 refills | Status: DC | PRN

## 2019-05-25 MED ORDER — LORAZEPAM 2 MG/ML IJ SOLN
1.0000 mg | Freq: Once | INTRAMUSCULAR | Status: AC
  Administered 2019-05-25: 1 mg via INTRAVENOUS
  Filled 2019-05-25: qty 1

## 2019-05-25 NOTE — ED Triage Notes (Addendum)
Pt comes via POV from home with c/o bilateral leg and hip pain. Pt states her legs feel heavy. Pt state this all started a week ago. Pt also states pain that shoots in her belly when she stands up.  Pt state she was seen by her PCP and informed to come here.  Pt denies any N/V/D. Pt denies any swelling. Pt states she does feel like their is fluid in her upper legs. Pt denies any recent injuries.  Pt also states her PCP thinks it could be a reaction from her COVID vaccine. Pt states 2nd shot was Jan 25th.

## 2019-05-25 NOTE — ED Triage Notes (Addendum)
First RN Note: Pt presents to ED via POV with c/o hip pain. Pt states was referred by PCP RN. Pt states was seen on Thursday for same however pain has gotten progressively worse. Pt initially states abdominal pain, then states only hip pain, when asked to clarify pt states it's in my abdomen then my hips and down my legs, "it's like my legs are too heavy for my body".

## 2019-05-25 NOTE — ED Notes (Signed)
Order for Ativan IV before MRI, MRI to call before getting pt.

## 2019-05-25 NOTE — ED Notes (Signed)
Pt to MRI

## 2019-05-25 NOTE — ED Notes (Signed)
Pt to ultrasound

## 2019-05-25 NOTE — ED Provider Notes (Signed)
[facility] Emergency Department Provider Note       Time seen: ----------------------------------------- 5:40 PM on 05/25/2019 -----------------------------------------   I have reviewed the triage vital signs and the nursing notes.  HISTORY   Chief Complaint Hip Pain, Leg Pain, and Abdominal Pain   HPI Ariel Doyle is a 59 y.o. female with a history of arthritis, chronic pain, degenerative disc disease who presents to the ED for bilateral leg and hip pain.  Patient states her legs feel heavy, this all started a week ago.  She was seen by her primary care doctor and informed to come here.  Patient thinks this may be a reaction to her second Covid vaccination.  If she is in a seated position her symptoms are minor, however if she is supine or erect she has the burning sensation in her legs and groin area.  Past Medical History:  Diagnosis Date  . Arthritis     Patient Active Problem List   Diagnosis Date Noted  . Psoriatic arthritis ([place]) 05/21/2019  . Chronic pain of left knee 03/30/2015  . DDD (degenerative disc disease), lumbosacral 08/13/2014  . 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 Allergic rhinitis 06/26/2009    Past Surgical History:  Procedure Laterality Date  . FOOT SURGERY     x 3  . FOOT SURGERY Right 10 years ago  . TUBAL LIGATION      Allergies Patient has no known allergies.  Social History Social History   Tobacco Use  . Smoking status: Never Smoker  . Smokeless tobacco: Never Used  Substance Use Topics  . Alcohol use: No  . Drug use: No    Review of Systems Constitutional: Negative for fever. Cardiovascular: Negative for chest pain. Respiratory: Negative for shortness of breath. Gastrointestinal: Positive for abdominal pain Musculoskeletal: Positive for hip and leg pain Skin: Negative for rash. Neurological: Negative for headaches, focal weakness or numbness.  All systems negative/normal/unremarkable except as  stated in the HPI  ____________________________________________   PHYSICAL EXAM:  VITAL SIGNS: ED Triage Vitals  Enc Vitals Group     BP 05/25/19 1533 (!) 179/85     Pulse Rate 05/25/19 1533 99     Resp 05/25/19 1533 18     Temp 05/25/19 1533 99.7 F (37.6 C)     Temp src --      SpO2 05/25/19 1533 99 %     Weight 05/25/19 1534 289 lb (131.1 kg)     Height 05/25/19 1534 5\' 7"  (1.702 m)     Head Circumference --      Peak Flow --      Pain Score 05/25/19 1533 10     Pain Loc --      Pain Edu? --      Excl. in [place]? --     Constitutional: Alert and oriented. Well appearing and in no distress.  Obese Eyes: Conjunctivae are normal. Normal extraocular movements. Cardiovascular: Normal rate, regular rhythm. No murmurs, rubs, or gallops. Respiratory: Normal respiratory effort without tachypnea nor retractions. Breath sounds are clear and equal bilaterally. No wheezes/rales/rhonchi. Gastrointestinal: Soft and nontender. Normal bowel sounds Musculoskeletal: Nontender with normal range of motion in extremities. No lower extremity tenderness nor edema.  Negative cross and straight leg raise examination while seated Neurologic:  Normal speech and language. No gross focal neurologic deficits are appreciated.  Skin:  Skin is warm, dry and intact. No rash noted. Psychiatric: Mood and affect are normal. Speech and behavior are normal.  ___________________________________________  ED COURSE:  As part of my medical decision making, I reviewed the following data within the [place] History obtained from family if available, nursing notes, old chart and ekg, as well as notes from prior ED visits. Patient presented for hip and leg pain, we will assess with labs and imaging as indicated at this time.   Procedures  Ariel Doyle was evaluated in Emergency Department on 05/25/2019 for the symptoms described in the history of present illness. She was evaluated in the context of  the global COVID-19 pandemic, which necessitated consideration that the patient might be at risk for infection with the SARS-CoV-2 virus that causes COVID-19. Institutional protocols and algorithms that pertain to the evaluation of patients at risk for COVID-19 are in a state of rapid change based on information released by regulatory bodies including the CDC and federal and state organizations. These policies and algorithms were followed during the patient's care in the ED.  ____________________________________________   LABS (pertinent positives/negatives)  Labs Reviewed  COMPREHENSIVE METABOLIC PANEL - Abnormal; Notable for the following components:      Result Value   Chloride 96 (*)    Glucose, Bld 288 (*)    AST 72 (*)    ALT 68 (*)    All other components within normal limits  URINALYSIS, COMPLETE (UACMP) WITH MICROSCOPIC - Abnormal; Notable for the following components:   Color, Urine YELLOW (*)    APPearance CLEAR (*)    Bacteria, UA RARE (*)    All other components within normal limits  LIPASE, BLOOD  CBC    RADIOLOGY Images were viewed by me  Ultrasound pelvis IMPRESSION: Anterior fundal fibroid. No acute findings. MRI lumbar sacral spine IMPRESSION:  1. 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  2. L4-5 advanced spinal stenosis from disc protrusion.  3. L4-5 left paracentral protrusion impinging on the L5 nerve root.  MRI left hip was normal  ____________________________________________   DIFFERENTIAL DIAGNOSIS   Spinal stenosis, degenerative disc disease, herniated disc, pelvic mass, postvaccination symptoms, neuropathy  FINAL ASSESSMENT AND PLAN  Spinal stenosis, herniated disc   Plan: The patient had presented for bilateral hip and groin pain. Patient's labs were grossly unremarkable. Patient's imaging revealed the cause of her inguinal pain likely L2 radiculopathy.  MRI revealed L2-3 severe spinal stenosis from migrating  disc extrusion as well as some L4-5 disease.  Patient has requested seeing a neurosurgeon in [place].  She will be given pain medicine, states she does not tolerate steroids well.  Patient has no signs of cauda equina syndrome, can ambulate short distances.  I will give her a walker, follow-up as soon as possible with neurosurgery.   Laurence Aly, MD    Note: This note was generated in part or whole with voice recognition software. Voice recognition is usually quite accurate but there are transcription errors that can and very often do occur. I apologize for any typographical errors that were not detected and corrected.     Earleen Newport, MD 05/25/19 2209

## 2019-05-26 ENCOUNTER — Ambulatory Visit (INDEPENDENT_AMBULATORY_CARE_PROVIDER_SITE_OTHER): Payer: BC Managed Care – PPO | Admitting: Family Medicine

## 2019-05-26 ENCOUNTER — Encounter: Payer: Self-pay | Admitting: Family Medicine

## 2019-05-26 DIAGNOSIS — D219 Benign neoplasm of connective and other soft tissue, unspecified: Secondary | ICD-10-CM

## 2019-05-26 DIAGNOSIS — M5126 Other intervertebral disc displacement, lumbar region: Secondary | ICD-10-CM | POA: Diagnosis not present

## 2019-05-26 DIAGNOSIS — M48061 Spinal stenosis, lumbar region without neurogenic claudication: Secondary | ICD-10-CM

## 2019-05-26 MED ORDER — GABAPENTIN 300 MG PO CAPS: 300.0000 mg | ORAL_CAPSULE | Freq: Three times a day (TID) | ORAL | 3 refills | Status: DC

## 2019-05-26 NOTE — Progress Notes (Signed)
Patient: Ariel Doyle Female    DOB: 1960-05-08   59 y.o.   MRN: [id number] Visit Date: 05/26/2019  Today's Provider: Lavon Paganini, MD   Chief Complaint  Patient presents with  . Follow-up   Subjective:    I [place] S. Dimas, CMA, am acting as scribe for Lavon Paganini, MD.   HPI  Follow up ER visit  Patient was seen in ER for bilateral hip and groin pain on 05/21/2019 and on 05/25/2019. She was treated for spinal stenosis, herniated disc. Treatment for this included labs, x-rays and MR and u/s. Referral to neurosurgery (would like ot see Dr Arnoldo Morale) She reports excellent compliance with treatment. She reports this condition is Worse.   Incidentally also found to have 24 cm anterior fundal fibroid.  She is not having any vaginal bleeding and is post-menopausal ------------------------------------------------------------------------------------   No Known Allergies   Current Outpatient Medications:  .  albuterol (PROVENTIL HFA;VENTOLIN HFA) 108 (90 Base) MCG/ACT inhaler, Inhale 2 puffs into the lungs every 6 (six) hours as needed for wheezing or shortness of breath., Disp: 1 Inhaler, Rfl: 2 .  cyclobenzaprine (FLEXERIL) 10 MG tablet, Take 1 tablet (10 mg total) by mouth 3 (three) times daily as needed., Disp: 15 tablet, Rfl: 0 .  diclofenac (VOLTAREN) 50 MG EC tablet, Take 50 mg by mouth 2 (two) times daily., Disp: , Rfl:  .  lansoprazole (PREVACID) 15 MG capsule, Take 15 mg by mouth daily., Disp: , Rfl:  .  oxyCODONE-acetaminophen (PERCOCET) 7.5-325 MG tablet, Take 1 tablet by mouth every 4 (four) hours as needed for severe pain., Disp: 20 tablet, Rfl: 0 .  predniSONE (DELTASONE) 20 MG tablet, Take 60mg  PO daily x 2 days, then40mg  PO daily x 2 days, then 20mg  PO daily x 3 days, Disp: 13 tablet, Rfl: 0 .  sulfaSALAzine (AZULFIDINE) 500 MG tablet, Take 1,000 mg by mouth 2 (two) times daily., Disp: , Rfl:  .  traMADol (ULTRAM) 50 MG tablet, Take 1 tablet (50 mg  total) by mouth every 6 (six) hours as needed., Disp: 20 tablet, Rfl: 0  Review of Systems  Constitutional: Positive for chills and fever.  Musculoskeletal: Positive for back pain and myalgias.    Social History   Tobacco Use  . Smoking status: Never Smoker  . Smokeless tobacco: Never Used  Substance Use Topics  . Alcohol use: No      Objective:   LMP 08/22/2014 Comment: irregular There were no vitals filed for this visit.There is no height or weight on file to calculate BMI.   Physical Exam Speaking in full sentences in NAD  No results found for any visits on 05/26/19.     Assessment & Plan    1. Spinal stenosis of lumbar region, unspecified whether neurogenic claudication present 2. 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 - ongoing bilateral hip pain with radiation to bilateral thighs x1 week - worsening  - noted to have spinal stenosis and hernaiated disc on MRI L spine in ED (reviewed with patient) - suspect that the central herniation and spinal stenosis at L3 is was contributing most to her pain currently as her pain is symmetric bilaterally -Finished prednisone burst and taper -Start gabapentin 300 mg 3 times daily-discussed possible side effects -Continue Flexeril as needed -Continue Percocet as needed, but use sparingly -Urgent referral to neurosurgery for further evaluation and management - Ambulatory referral to Neurosurgery  3. Fibroid -Incidental finding -Discussed with the patient that this  is not contributing to her current pain -Discussed that fibroids typically get smaller after menopause -As she is asymptomatic, there is no further work-up for this needed    Meds ordered this encounter  Medications  . gabapentin (NEURONTIN) 300 MG capsule    Sig: Take 1 capsule (300 mg total) by mouth 3 (three) times daily.    Dispense:  90 capsule    Refill:  3     Return in about 3 days (around 05/29/2019).   The entirety of the information  documented in the History of Present Illness, Review of Systems and Physical Exam were personally obtained by me. Portions of this information were initially documented by Lynford Humphrey, CMA and reviewed by me for thoroughness and accuracy.    Italo Banton, Dionne Bucy, MD MPH [facility]

## 2019-05-27 ENCOUNTER — Other Ambulatory Visit: Payer: Self-pay | Admitting: Neurosurgery

## 2019-05-27 ENCOUNTER — Encounter (HOSPITAL_COMMUNITY): Payer: Self-pay | Admitting: Neurosurgery

## 2019-05-27 ENCOUNTER — Observation Stay (HOSPITAL_COMMUNITY)
Admission: AD | Admit: 2019-05-27 | Discharge: 2019-05-29 | Disposition: A | Discharge status: Home / Self Care | Payer: BC Managed Care – PPO | Source: Ambulatory Visit | Attending: Neurosurgery | Admitting: Neurosurgery

## 2019-05-27 ENCOUNTER — Telehealth: Payer: Self-pay

## 2019-05-27 ENCOUNTER — Observation Stay (HOSPITAL_COMMUNITY): Payer: BC Managed Care – PPO

## 2019-05-27 DIAGNOSIS — M1990 Unspecified osteoarthritis, unspecified site: Secondary | ICD-10-CM | POA: Insufficient documentation

## 2019-05-27 DIAGNOSIS — K219 Gastro-esophageal reflux disease without esophagitis: Secondary | ICD-10-CM | POA: Diagnosis not present

## 2019-05-27 DIAGNOSIS — Z7952 Long term (current) use of systemic steroids: Secondary | ICD-10-CM | POA: Insufficient documentation

## 2019-05-27 DIAGNOSIS — M48062 Spinal stenosis, lumbar region with neurogenic claudication: Secondary | ICD-10-CM | POA: Insufficient documentation

## 2019-05-27 DIAGNOSIS — Z79899 Other long term (current) drug therapy: Secondary | ICD-10-CM | POA: Diagnosis not present

## 2019-05-27 DIAGNOSIS — Z01818 Encounter for other preprocedural examination: Secondary | ICD-10-CM

## 2019-05-27 DIAGNOSIS — M5126 Other intervertebral disc displacement, lumbar region: Secondary | ICD-10-CM | POA: Diagnosis present

## 2019-05-27 DIAGNOSIS — M5116 Intervertebral disc disorders with radiculopathy, lumbar region: Secondary | ICD-10-CM | POA: Diagnosis not present

## 2019-05-27 DIAGNOSIS — Z419 Encounter for procedure for purposes other than remedying health state, unspecified: Secondary | ICD-10-CM

## 2019-05-27 DIAGNOSIS — Z20822 Contact with and (suspected) exposure to covid-19: Secondary | ICD-10-CM | POA: Insufficient documentation

## 2019-05-27 LAB — CBC
HCT: 41.6 % (ref 36.0–46.0)
Hemoglobin: 12.8 g/dL (ref 12.0–15.0)
MCH: 26.3 pg (ref 26.0–34.0)
MCHC: 30.8 g/dL (ref 30.0–36.0)
MCV: 85.6 fL (ref 80.0–100.0)
Platelets: 180 10*3/uL (ref 150–400)
RBC: 4.86 MIL/uL (ref 3.87–5.11)
RDW: 13.3 % (ref 11.5–15.5)
WBC: 9 10*3/uL (ref 4.0–10.5)
nRBC: 0 % (ref 0.0–0.2)

## 2019-05-27 LAB — BASIC METABOLIC PANEL
Anion gap: 12 (ref 5–15)
BUN: 16 mg/dL (ref 6–20)
CO2: 27 mmol/L (ref 22–32)
Calcium: 8.8 mg/dL — ABNORMAL LOW (ref 8.9–10.3)
Chloride: 97 mmol/L — ABNORMAL LOW (ref 98–111)
Creatinine, Ser: 0.83 mg/dL (ref 0.44–1.00)
GFR calc Af Amer: >60 mL/min (ref >60)
GFR calc non Af Amer: >60 mL/min (ref >60)
Glucose, Bld: 96 mg/dL (ref 70–99)
Potassium: 4.1 mmol/L (ref 3.5–5.1)
Sodium: 136 mmol/L (ref 135–145)

## 2019-05-27 LAB — SURGICAL PCR SCREEN
MRSA, PCR: NEGATIVE
Staphylococcus aureus: NEGATIVE

## 2019-05-27 LAB — HIV ANTIBODY (ROUTINE TESTING W REFLEX): HIV Screen 4th Generation wRfx: NONREACTIVE

## 2019-05-27 LAB — PROTIME-INR
INR: 0.9 (ref 0.8–1.2)
Prothrombin Time: 12.1 seconds (ref 11.4–15.2)

## 2019-05-27 LAB — SARS CORONAVIRUS 2 (TAT 6-24 HRS): SARS Coronavirus 2: NEGATIVE

## 2019-05-27 MED ORDER — DEXAMETHASONE SODIUM PHOSPHATE 10 MG/ML IJ SOLN
6.0000 mg | Freq: Four times a day (QID) | INTRAMUSCULAR | Status: DC
  Administered 2019-05-27 – 2019-05-28 (×4): 6 mg via INTRAVENOUS
  Filled 2019-05-27 (×4): qty 1

## 2019-05-27 MED ORDER — DOCUSATE SODIUM 100 MG PO CAPS
100.0000 mg | ORAL_CAPSULE | Freq: Two times a day (BID) | ORAL | Status: DC
  Administered 2019-05-27: 20:00:00 100 mg via ORAL
  Filled 2019-05-27: qty 1

## 2019-05-27 MED ORDER — HYDROMORPHONE HCL 1 MG/ML IJ SOLN
0.5000 mg | INTRAMUSCULAR | Status: DC | PRN
  Administered 2019-05-27: 23:00:00 1 mg via INTRAVENOUS
  Filled 2019-05-27: qty 1

## 2019-05-27 MED ORDER — ACETAMINOPHEN 325 MG PO TABS
650.0000 mg | ORAL_TABLET | Freq: Four times a day (QID) | ORAL | Status: DC | PRN
  Administered 2019-05-27 – 2019-05-28 (×2): 650 mg via ORAL
  Filled 2019-05-27 (×2): qty 2

## 2019-05-27 MED ORDER — CEFAZOLIN SODIUM-DEXTROSE 2-4 GM/100ML-% IV SOLN
2.0000 g | INTRAVENOUS | Status: AC
  Administered 2019-05-28: 15:00:00 3 g via INTRAVENOUS

## 2019-05-27 MED ORDER — OXYCODONE HCL 5 MG PO TABS
5.0000 mg | ORAL_TABLET | ORAL | Status: DC | PRN
  Administered 2019-05-27: 20:00:00 5 mg via ORAL
  Filled 2019-05-27: qty 1

## 2019-05-27 MED ORDER — GABAPENTIN 300 MG PO CAPS
300.0000 mg | ORAL_CAPSULE | Freq: Three times a day (TID) | ORAL | Status: DC
  Administered 2019-05-27 – 2019-05-29 (×4): 300 mg via ORAL
  Filled 2019-05-27 (×4): qty 1

## 2019-05-27 MED ORDER — PANTOPRAZOLE SODIUM 40 MG PO TBEC
40.0000 mg | DELAYED_RELEASE_TABLET | Freq: Every day | ORAL | Status: DC
  Administered 2019-05-27 – 2019-05-29 (×3): 40 mg via ORAL
  Filled 2019-05-27 (×3): qty 1

## 2019-05-27 MED ORDER — CHLORHEXIDINE GLUCONATE CLOTH 2 % EX PADS: 6.0000 | MEDICATED_PAD | Freq: Once | CUTANEOUS | Status: AC

## 2019-05-27 MED ORDER — CHLORHEXIDINE GLUCONATE CLOTH 2 % EX PADS
6.0000 | MEDICATED_PAD | Freq: Once | CUTANEOUS | Status: AC
  Administered 2019-05-27: 6 via TOPICAL

## 2019-05-27 MED ORDER — POTASSIUM CHLORIDE IN NACL 20-0.9 MEQ/L-% IV SOLN
INTRAVENOUS | Status: DC
  Filled 2019-05-27 (×2): qty 1000

## 2019-05-27 MED ORDER — MORPHINE SULFATE (PF) 2 MG/ML IV SOLN
2.0000 mg | INTRAVENOUS | Status: DC | PRN
  Administered 2019-05-27: 19:00:00 4 mg via INTRAVENOUS
  Filled 2019-05-27: qty 2

## 2019-05-27 MED ORDER — OXYCODONE-ACETAMINOPHEN 7.5-325 MG PO TABS
1.0000 | ORAL_TABLET | ORAL | Status: DC | PRN
  Administered 2019-05-28 (×3): 1 via ORAL
  Filled 2019-05-27 (×3): qty 1

## 2019-05-27 MED ORDER — ACETAMINOPHEN 650 MG RE SUPP: 650.0000 mg | Freq: Four times a day (QID) | RECTAL | Status: DC | PRN

## 2019-05-27 MED ORDER — SULFASALAZINE 500 MG PO TABS
2000.0000 mg | ORAL_TABLET | Freq: Every evening | ORAL | Status: DC
  Administered 2019-05-27: 20:00:00 2000 mg via ORAL
  Filled 2019-05-27 (×3): qty 4

## 2019-05-27 NOTE — Progress Notes (Signed)
Ariel Doyle was added on for surgery this afternoon, surgery is for 2/10/20212 at 1547.   I called and asked Dr Arnoldo Morale what time Ariel Doyle needs to be NPO. Dr Arnoldo Morale said 0500. I called Ariel Doyle and was doing asking medical and surgical history and noticed that Ariel Sconce is an inpatient. I ended the call.  I called Ariel Doyle's nurse to inform her that Ariel Doyle is NPO after 0500.

## 2019-05-27 NOTE — H&P (Signed)
Subjective:   The patient is a 59 year old white female nonsmoker who was in her usual state of good health until 05/17/2019 when she began having back pain and pain into her legs.  She does not recall any precipitating events.  Her pain has worsened.  She saw her primary doctor and was treated with medications including tramadol, Toradol and Flexeril.  Unfortunately she did not improve.  She was seen at the [facility] ER twice and worked up with x-rays and a hip and lumbar MRI which demonstrated a large herniated disc at L2-3.  I saw her in the office this afternoon.  We discussed the various treatment options.  She has decided to proceed with a L2-3 diskectomy.  She is being admitted for pain control and IV steroids.   Past Medical History:  Diagnosis Date  . Arthritis     Past Surgical History:  Procedure Laterality Date  . FOOT SURGERY Right    Buninectomy and toes straightened  . FOOT SURGERY Right    2nd  repair of toes that were previously operated on  . FOOT SURGERY Right     a pin removed  . KNEE ARTHROSCOPY Left    meniscus tear  . TUBAL LIGATION      No Known Allergies  Social History   Tobacco Use  . Smoking status: Never Smoker  . Smokeless tobacco: Never Used  Substance Use Topics  . Alcohol use: No    Family History  Problem Relation Age of Onset  . Breast cancer Mother   . Diabetes Father   . Heart attack Father    Prior to Admission medications   Medication Sig Start Date End Date Taking? Authorizing Provider  cetirizine (ZYRTEC) 10 MG tablet Take 10 mg by mouth daily as needed for allergies.    [provider]  Diclofenac Sodium CR 100 MG 24 hr tablet Take 100 mg by mouth every evening. 05/01/19   [provider]  gabapentin (NEURONTIN) 300 MG capsule Take 1 capsule (300 mg total) by mouth 3 (three) times daily. 05/26/19   Virginia Crews, MD  lansoprazole (PREVACID) 15 MG capsule Take 15 mg by mouth daily as needed (acid reflux).     [provider]  oxyCODONE-acetaminophen (PERCOCET) 7.5-325 MG tablet Take 1 tablet by mouth every 4 (four) hours as needed for severe pain. 05/25/19 05/24/20  Earleen Newport, MD  predniSONE (DELTASONE) 20 MG tablet Take 60mg  PO daily x 2 days, then40mg  PO daily x 2 days, then 20mg  PO daily x 3 days Patient taking differently: Take 20-60 mg by mouth See admin instructions. Take 60mg  daily x 2 days, then 40mg  daily x 2 days, then 20mg   daily x 3 days 05/23/19   Virginia Crews, MD  sulfaSALAzine (AZULFIDINE) 500 MG tablet Take 1,000 mg by mouth 2 (two) times daily. 03/30/17   [provider]  traMADol (ULTRAM) 50 MG tablet Take 50 mg by mouth every 6 (six) hours as needed for moderate pain.    [provider]     Review of Systems  Positive ROS:   As above  All other systems have been reviewed and were otherwise negative with the exception of those mentioned in the HPI and as above.  Objective: Vital signs in last 24 hours: Temp:  [98.7 F (37.1 C)] 98.7 F (37.1 C) (02/09 1632) Pulse Rate:  [88] 88 (02/09 1632) Resp:  [18] 18 (02/09 1632) BP: (141)/(67) 141/67 (02/09 1632) Estimated body  mass index is 45.26 kg/m as calculated from the following:   Height as of 05/25/19: 5\' 7"  (1.702 m).   Weight as of 05/25/19: 131.1 kg.   General Appearance: Alert Head: Normocephalic, without obvious abnormality, atraumatic Eyes: PERRL, conjunctiva/corneas clear, EOM's intact,    Ears: Normal  Throat: Normal  Neck: Supple, Back: unremarkable Lungs: Clear to auscultation bilaterally, respirations unlabored Heart: Regular rate and rhythm, no murmur, rub or gallop Abdomen: Soft, non-tender, obese Extremities: Extremities normal, atraumatic, no cyanosis or edema Skin: unremarkable  NEUROLOGIC:    the patient is alert and oriented x3.  Cranial nerve exam is grossly normal.  Region hearing grossly normal bilaterally.  The patient's motor strength is fairly normal except she has  weakness in her left iliopsoas and left quadriceps at 4/5.  Straight leg raise testing is positive on the left.  Faber's testing is negative bilaterally.    I have reviewed the patient's lumbar MRI performed at [facility] on 05/25/2019.  She has a straightened lumbar spine with bulging disc at several levels.  She has a large herniated disc at L2-3 central into the left.  There is significant spinal stenosis at L2-3.   Data Review Lab Results  Component Value Date   WBC 8.1 05/25/2019   HGB 13.3 05/25/2019   HCT 42.7 05/25/2019   MCV 85.7 05/25/2019   PLT 277 05/25/2019   Lab Results  Component Value Date   NA 136 05/25/2019   K 4.5 05/25/2019   CL 96 (L) 05/25/2019   CO2 29 05/25/2019   BUN 16 05/25/2019   CREATININE 0.90 05/25/2019   GLUCOSE 288 (H) 05/25/2019   No results found for: INR, PROTIME  Assessment/Plan:   L2-3 herniated disc, spinal stenosis, lumbago, lumbar radiculopathy, neurogenic claudication: I have discussed the situation with the patient.  I have reviewed her MRI scan with her and pointed out the abnormalities.  We have discussed the various treatment options including left L2-3 diskectomy and  possible bilateral diskectomy at L2-3.  I have shown her surgical models.  We have discussed the risks, benefits, alternatives, expected postoperative course, and likelihood of achieving our goals with surgery.  I have answered all her questions.  She wants to proceed with surgery.  She is being admitted for pain control and IV steroids.   Ophelia Charter 05/27/2019 5:59 PM

## 2019-05-27 NOTE — Telephone Encounter (Signed)
Copied from [place] [id number]. Topic: Referral - Status >> May 27, 2019 12:11 PM Virl Axe D wrote: Reason for CRM: Pt's husband called to follow up on referral for Dr. Arnoldo Morale. Stated pt was in a lot of pain  and he would like to know if there is anything that can be done to speed up the process. Stated Dr. Jacinto Reap was placing a STAT referral for pt. Please advise.

## 2019-05-28 ENCOUNTER — Observation Stay (HOSPITAL_COMMUNITY): Payer: BC Managed Care – PPO | Admitting: Certified Registered Nurse Anesthetist

## 2019-05-28 ENCOUNTER — Ambulatory Visit (HOSPITAL_COMMUNITY): Admission: RE | Admit: 2019-05-28 | Payer: BC Managed Care – PPO | Source: Home / Self Care | Admitting: Neurosurgery

## 2019-05-28 ENCOUNTER — Other Ambulatory Visit: Payer: Self-pay

## 2019-05-28 ENCOUNTER — Observation Stay (HOSPITAL_COMMUNITY): Payer: BC Managed Care – PPO

## 2019-05-28 ENCOUNTER — Encounter (HOSPITAL_COMMUNITY): Payer: Self-pay | Admitting: Neurosurgery

## 2019-05-28 ENCOUNTER — Encounter (HOSPITAL_COMMUNITY)
Admission: AD | Disposition: A | Discharge status: Home / Self Care | Payer: Self-pay | Source: Ambulatory Visit | Attending: Neurosurgery

## 2019-05-28 DIAGNOSIS — Z20822 Contact with and (suspected) exposure to covid-19: Secondary | ICD-10-CM | POA: Diagnosis not present

## 2019-05-28 DIAGNOSIS — M5116 Intervertebral disc disorders with radiculopathy, lumbar region: Secondary | ICD-10-CM | POA: Diagnosis not present

## 2019-05-28 DIAGNOSIS — Z79899 Other long term (current) drug therapy: Secondary | ICD-10-CM | POA: Diagnosis not present

## 2019-05-28 DIAGNOSIS — M48062 Spinal stenosis, lumbar region with neurogenic claudication: Secondary | ICD-10-CM | POA: Diagnosis not present

## 2019-05-28 HISTORY — PX: LUMBAR LAMINECTOMY/DECOMPRESSION MICRODISCECTOMY: SHX5026

## 2019-05-28 SURGERY — LUMBAR LAMINECTOMY/DECOMPRESSION MICRODISCECTOMY 1 LEVEL
Anesthesia: General | Site: Spine Lumbar | Laterality: Left

## 2019-05-28 MED ORDER — ACETAMINOPHEN 650 MG RE SUPP: 650.0000 mg | RECTAL | Status: DC | PRN

## 2019-05-28 MED ORDER — ROCURONIUM BROMIDE 50 MG/5ML IV SOSY
PREFILLED_SYRINGE | INTRAVENOUS | Status: DC | PRN
  Administered 2019-05-28: 15 mg via INTRAVENOUS
  Administered 2019-05-28: 70 mg via INTRAVENOUS

## 2019-05-28 MED ORDER — ZOLPIDEM TARTRATE 5 MG PO TABS: 5.0000 mg | ORAL_TABLET | Freq: Every evening | ORAL | Status: DC | PRN

## 2019-05-28 MED ORDER — 0.9 % SODIUM CHLORIDE (POUR BTL) OPTIME
TOPICAL | Status: DC | PRN
  Administered 2019-05-28: 1000 mL

## 2019-05-28 MED ORDER — LACTATED RINGERS IV SOLN: INTRAVENOUS | Status: DC | PRN

## 2019-05-28 MED ORDER — SCOPOLAMINE 1 MG/3DAYS TD PT72: 1.0000 | MEDICATED_PATCH | Freq: Once | TRANSDERMAL | Status: DC

## 2019-05-28 MED ORDER — MENTHOL 3 MG MT LOZG: 1.0000 | LOZENGE | OROMUCOSAL | Status: DC | PRN

## 2019-05-28 MED ORDER — ACETAMINOPHEN 500 MG PO TABS
1000.0000 mg | ORAL_TABLET | Freq: Four times a day (QID) | ORAL | Status: DC
  Filled 2019-05-28: qty 2

## 2019-05-28 MED ORDER — MIDAZOLAM HCL 2 MG/2ML IJ SOLN: 0.5000 mg | Freq: Once | INTRAMUSCULAR | Status: DC | PRN

## 2019-05-28 MED ORDER — DOCUSATE SODIUM 100 MG PO CAPS
100.0000 mg | ORAL_CAPSULE | Freq: Two times a day (BID) | ORAL | Status: DC
  Administered 2019-05-28 – 2019-05-29 (×2): 100 mg via ORAL
  Filled 2019-05-28 (×2): qty 1

## 2019-05-28 MED ORDER — BACITRACIN ZINC 500 UNIT/GM EX OINT
TOPICAL_OINTMENT | CUTANEOUS | Status: DC | PRN
  Administered 2019-05-28: 1 via TOPICAL

## 2019-05-28 MED ORDER — FENTANYL CITRATE (PF) 100 MCG/2ML IJ SOLN
INTRAMUSCULAR | Status: DC | PRN
  Administered 2019-05-28 (×2): 50 ug via INTRAVENOUS

## 2019-05-28 MED ORDER — ONDANSETRON HCL 4 MG/2ML IJ SOLN
INTRAMUSCULAR | Status: DC | PRN
  Administered 2019-05-28: 4 mg via INTRAVENOUS

## 2019-05-28 MED ORDER — PROMETHAZINE HCL 25 MG/ML IJ SOLN: 6.2500 mg | INTRAMUSCULAR | Status: DC | PRN

## 2019-05-28 MED ORDER — WHITE PETROLATUM EX OINT
TOPICAL_OINTMENT | CUTANEOUS | Status: AC
  Filled 2019-05-28: qty 28.35

## 2019-05-28 MED ORDER — SODIUM CHLORIDE 0.9 % IV SOLN
INTRAVENOUS | Status: DC | PRN
  Administered 2019-05-28: 500 mL

## 2019-05-28 MED ORDER — HYDROMORPHONE HCL 1 MG/ML IJ SOLN
0.2500 mg | INTRAMUSCULAR | Status: DC | PRN
  Administered 2019-05-28 (×2): 0.5 mg via INTRAVENOUS

## 2019-05-28 MED ORDER — DEXTROSE 5 % IV SOLN
3.0000 g | Freq: Three times a day (TID) | INTRAVENOUS | Status: AC
  Administered 2019-05-29: 01:00:00 3 g via INTRAVENOUS
  Filled 2019-05-28 (×5): qty 3000

## 2019-05-28 MED ORDER — BUPIVACAINE HCL (PF) 0.5 % IJ SOLN
INTRAMUSCULAR | Status: AC
  Filled 2019-05-28: qty 30

## 2019-05-28 MED ORDER — SODIUM CHLORIDE 0.9 % IV SOLN: 250.0000 mL | INTRAVENOUS | Status: DC

## 2019-05-28 MED ORDER — OXYCODONE HCL 5 MG PO TABS: 10.0000 mg | ORAL_TABLET | ORAL | Status: DC | PRN

## 2019-05-28 MED ORDER — LIDOCAINE-EPINEPHRINE 1 %-1:100000 IJ SOLN
INTRAMUSCULAR | Status: AC
  Filled 2019-05-28: qty 1

## 2019-05-28 MED ORDER — PROPOFOL 10 MG/ML IV BOLUS
INTRAVENOUS | Status: DC | PRN
  Administered 2019-05-28: 200 mg via INTRAVENOUS

## 2019-05-28 MED ORDER — MORPHINE SULFATE (PF) 4 MG/ML IV SOLN: 4.0000 mg | INTRAVENOUS | Status: DC | PRN

## 2019-05-28 MED ORDER — CYCLOBENZAPRINE HCL 10 MG PO TABS
10.0000 mg | ORAL_TABLET | Freq: Three times a day (TID) | ORAL | Status: DC | PRN
  Administered 2019-05-28 – 2019-05-29 (×2): 10 mg via ORAL
  Filled 2019-05-28 (×2): qty 1

## 2019-05-28 MED ORDER — SODIUM CHLORIDE 0.9% FLUSH
3.0000 mL | Freq: Two times a day (BID) | INTRAVENOUS | Status: DC
  Administered 2019-05-28: 21:00:00 3 mL via INTRAVENOUS

## 2019-05-28 MED ORDER — ONDANSETRON HCL 4 MG/2ML IJ SOLN: 4.0000 mg | Freq: Four times a day (QID) | INTRAMUSCULAR | Status: DC | PRN

## 2019-05-28 MED ORDER — THROMBIN 5000 UNITS EX SOLR
CUTANEOUS | Status: AC
  Filled 2019-05-28: qty 5000

## 2019-05-28 MED ORDER — OXYCODONE-ACETAMINOPHEN 7.5-325 MG PO TABS
1.0000 | ORAL_TABLET | ORAL | Status: DC | PRN
  Administered 2019-05-28 – 2019-05-29 (×4): 1 via ORAL
  Filled 2019-05-28 (×4): qty 1

## 2019-05-28 MED ORDER — SODIUM CHLORIDE 0.9% FLUSH
3.0000 mL | INTRAVENOUS | Status: DC | PRN
  Administered 2019-05-28: 3 mL via INTRAVENOUS

## 2019-05-28 MED ORDER — ONDANSETRON HCL 4 MG PO TABS: 4.0000 mg | ORAL_TABLET | Freq: Four times a day (QID) | ORAL | Status: DC | PRN

## 2019-05-28 MED ORDER — SUGAMMADEX SODIUM 200 MG/2ML IV SOLN
INTRAVENOUS | Status: DC | PRN
  Administered 2019-05-28: 400 mg via INTRAVENOUS

## 2019-05-28 MED ORDER — THROMBIN 5000 UNITS EX SOLR
OROMUCOSAL | Status: DC | PRN
  Administered 2019-05-28: 20 mL via TOPICAL

## 2019-05-28 MED ORDER — ACETAMINOPHEN 325 MG PO TABS
650.0000 mg | ORAL_TABLET | ORAL | Status: DC | PRN
  Administered 2019-05-29: 09:00:00 650 mg via ORAL
  Filled 2019-05-28: qty 2

## 2019-05-28 MED ORDER — BISACODYL 10 MG RE SUPP: 10.0000 mg | Freq: Every day | RECTAL | Status: DC | PRN

## 2019-05-28 MED ORDER — LIDOCAINE 2% (20 MG/ML) 5 ML SYRINGE
INTRAMUSCULAR | Status: DC | PRN
  Administered 2019-05-28: 50 mg via INTRAVENOUS

## 2019-05-28 MED ORDER — DEXAMETHASONE SODIUM PHOSPHATE 10 MG/ML IJ SOLN
INTRAMUSCULAR | Status: DC | PRN
  Administered 2019-05-28: 10 mg via INTRAVENOUS

## 2019-05-28 MED ORDER — PHENOL 1.4 % MT LIQD: 1.0000 | OROMUCOSAL | Status: DC | PRN

## 2019-05-28 MED ORDER — MEPERIDINE HCL 25 MG/ML IJ SOLN: 6.2500 mg | INTRAMUSCULAR | Status: DC | PRN

## 2019-05-28 MED ORDER — MIDAZOLAM HCL 5 MG/5ML IJ SOLN
INTRAMUSCULAR | Status: DC | PRN
  Administered 2019-05-28: 2 mg via INTRAVENOUS

## 2019-05-28 MED ORDER — BACITRACIN ZINC 500 UNIT/GM EX OINT
TOPICAL_OINTMENT | CUTANEOUS | Status: AC
  Filled 2019-05-28: qty 28.35

## 2019-05-28 SURGICAL SUPPLY — 46 items
BAG DECANTER FOR FLEXI CONT (MISCELLANEOUS) ×3 IMPLANT
BENZOIN TINCTURE PRP APPL 2/3 (GAUZE/BANDAGES/DRESSINGS) ×3 IMPLANT
BLADE CLIPPER SURG (BLADE) IMPLANT
BUR MATCHSTICK NEURO 3.0 LAGG (BURR) ×3 IMPLANT
BUR PRECISION FLUTE 6.0 (BURR) ×3 IMPLANT
CANISTER SUCT 3000ML PPV (MISCELLANEOUS) ×3 IMPLANT
CARTRIDGE OIL MAESTRO DRILL (MISCELLANEOUS) ×1 IMPLANT
CLOSURE WOUND 1/2 X4 (GAUZE/BANDAGES/DRESSINGS) ×1
COVER WAND RF STERILE (DRAPES) ×3 IMPLANT
DIFFUSER DRILL AIR PNEUMATIC (MISCELLANEOUS) ×3 IMPLANT
DRAPE LAPAROTOMY 100X72X124 (DRAPES) ×3 IMPLANT
DRAPE MICROSCOPE LEICA (MISCELLANEOUS) ×3 IMPLANT
DRAPE SURG 17X23 STRL (DRAPES) ×12 IMPLANT
DRSG OPSITE POSTOP 4X6 (GAUZE/BANDAGES/DRESSINGS) ×3 IMPLANT
ELECT BLADE 4.0 EZ CLEAN MEGAD (MISCELLANEOUS) ×3
ELECT REM PT RETURN 9FT ADLT (ELECTROSURGICAL) ×3
ELECTRODE BLDE 4.0 EZ CLN MEGD (MISCELLANEOUS) ×1 IMPLANT
ELECTRODE REM PT RTRN 9FT ADLT (ELECTROSURGICAL) ×1 IMPLANT
GAUZE 4X4 16PLY RFD (DISPOSABLE) IMPLANT
GAUZE SPONGE 4X4 12PLY STRL (GAUZE/BANDAGES/DRESSINGS) ×3 IMPLANT
GLOVE BIO SURGEON STRL SZ8 (GLOVE) ×3 IMPLANT
GLOVE BIO SURGEON STRL SZ8.5 (GLOVE) ×3 IMPLANT
GLOVE EXAM NITRILE XL STR (GLOVE) IMPLANT
GOWN STRL REUS W/ TWL LRG LVL3 (GOWN DISPOSABLE) IMPLANT
GOWN STRL REUS W/ TWL XL LVL3 (GOWN DISPOSABLE) ×1 IMPLANT
GOWN STRL REUS W/TWL 2XL LVL3 (GOWN DISPOSABLE) IMPLANT
GOWN STRL REUS W/TWL LRG LVL3 (GOWN DISPOSABLE)
GOWN STRL REUS W/TWL XL LVL3 (GOWN DISPOSABLE) ×2
KIT BASIN OR (CUSTOM PROCEDURE TRAY) ×3 IMPLANT
KIT TURNOVER KIT B (KITS) ×3 IMPLANT
NEEDLE HYPO 21X1.5 SAFETY (NEEDLE) IMPLANT
NEEDLE HYPO 22GX1.5 SAFETY (NEEDLE) ×3 IMPLANT
NS IRRIG 1000ML POUR BTL (IV SOLUTION) ×3 IMPLANT
OIL CARTRIDGE MAESTRO DRILL (MISCELLANEOUS) ×3
PACK LAMINECTOMY NEURO (CUSTOM PROCEDURE TRAY) ×3 IMPLANT
PAD ARMBOARD 7.5X6 YLW CONV (MISCELLANEOUS) ×9 IMPLANT
PATTIES SURGICAL .5 X1 (DISPOSABLE) IMPLANT
RUBBERBAND STERILE (MISCELLANEOUS) ×6 IMPLANT
SPONGE SURGIFOAM ABS GEL SZ50 (HEMOSTASIS) ×3 IMPLANT
STRIP CLOSURE SKIN 1/2X4 (GAUZE/BANDAGES/DRESSINGS) ×2 IMPLANT
SUT VIC AB 1 CT1 18XBRD ANBCTR (SUTURE) ×1 IMPLANT
SUT VIC AB 1 CT1 8-18 (SUTURE) ×2
SUT VIC AB 2-0 CP2 18 (SUTURE) ×3 IMPLANT
TOWEL GREEN STERILE (TOWEL DISPOSABLE) ×3 IMPLANT
TOWEL GREEN STERILE FF (TOWEL DISPOSABLE) ×3 IMPLANT
WATER STERILE IRR 1000ML POUR (IV SOLUTION) ×3 IMPLANT

## 2019-05-28 NOTE — Op Note (Signed)
Brief history: The patient is a 59 year old white female who has complained of back and left great and right leg pain.  She failed medical management and was worked up with a lumbar MRI which demonstrated a large herniated disc at L2-3 on the left.  I discussed the various treatment options with her.  She has decided to proceed with surgery after weighing the risks, benefits and alternatives.  Preoperative diagnosis: Left L2-3 herniated disc, spinal stenosis, lumbago, lumbar radiculopathy  Postoperative diagnosis: The same  Procedure: Left L2-3 intervertebral discectomy using micro-dissection  Surgeon: Dr. Earle Gell  Asst.: Dr. Deatra Ina and Arnetha Massy nurse practitioner  Anesthesia: Gen. endotracheal  Estimated blood loss: 75 cc  Drains: None  Complications: None  Description of procedure: The patient was brought to the operating room by the anesthesia team. General endotracheal anesthesia was induced. The patient was turned to the prone position on the Wilson frame. The patient's lumbosacral region was then prepared with Betadine scrub and Betadine solution. Sterile drapes were applied.  I then injected the area to be incised with Marcaine with epinephrine solution. I then used a scalpel to make a linear midline incision over the L2-3 intervertebral disc space. I then used electrocautery to perform a left sided subperiosteal dissection exposing the spinous process and lamina of L2 and L3. We obtained intraoperative radiograph to confirm our location. I then inserted the [facility] retractor for exposure.  We then brought the operative microscope into the field. Under its magnification and illumination we completed the microdissection. I used a high-speed drill to perform a laminotomy at L2-3 on the left. I then used a Kerrison punches to widen the laminotomy and removed the ligamentum flavum at L2-3 on the left. We then used microdissection to free up the thecal sac and the L3  nerve root from the epidural tissue. I then used a Kerrison punch to perform a foraminotomy at about the L3 nerve root.  The assistant then used the nerve root retractor to gently retract the thecal sac and the L3 nerve root medially. This exposed the intervertebral disc. We identified the ruptured disc and remove it with the pituitary forceps.  We inspected the intervertebral disc at L2-3.  There was a small hole in the annulus but no impending herniations.  We did not perform an intervertebral discectomy.  I then palpated along the ventral surface of the thecal sac and along exit route of the L3 nerve root and noted that the neural structures were well decompressed. This completed the decompression.  We then obtained hemostasis using bipolar electrocautery. We irrigated the wound out with bacitracin solution. We then removed the retractor. We then reapproximated the patient's thoracolumbar fascia with interrupted #1 Vicryl suture. We then reapproximated the patient's subcutaneous tissue with interrupted 2-0 Vicryl suture. We then reapproximated patient's skin with Steri-Strips and benzoin. The was then coated with bacitracin ointment. The drapes were removed. The patient was subsequently returned to the supine position where they were extubated by the anesthesia team. The patient was then transported to the postanesthesia care unit in stable condition. All sponge instrument and needle counts were reportedly correct at the end of this case.

## 2019-05-28 NOTE — Anesthesia Preprocedure Evaluation (Addendum)
Anesthesia Evaluation  Patient identified by MRN, date of birth, ID band Patient awake    Reviewed: Allergy & Precautions, NPO status , Patient's Chart, lab work & pertinent test results  History of Anesthesia Complications Negative for: history of anesthetic complications  Airway Mallampati: II  TM Distance: >3 FB Neck ROM: Full    Dental  (+) Dental Advisory Given   Pulmonary neg pulmonary ROS,  05/27/2019 SARS coronavirus NEG   breath sounds clear to auscultation       Cardiovascular negative cardio ROS   Rhythm:Regular Rate:Normal     Neuro/Psych Back pain    GI/Hepatic Neg liver ROS, GERD  Medicated and Controlled,  Endo/Other  negative endocrine ROS  Renal/GU negative Renal ROS     Musculoskeletal  (+) Arthritis ,   Abdominal (+) + obese,   Peds  Hematology negative hematology ROS (+)   Anesthesia Other Findings   Reproductive/Obstetrics negative OB ROS                            Anesthesia Physical Anesthesia Plan  ASA: II  Anesthesia Plan: General   Post-op Pain Management:    Induction: Intravenous  PONV Risk Score and Plan: Scopolamine patch - Pre-op, Dexamethasone and Ondansetron  Airway Management Planned: Oral ETT  Additional Equipment:   Intra-op Plan:   Post-operative Plan: Extubation in OR  Informed Consent: I have reviewed the patients History and Physical, chart, labs and discussed the procedure including the risks, benefits and alternatives for the proposed anesthesia with the patient or authorized representative who has indicated his/her understanding and acceptance.     Dental advisory given  Plan Discussed with: CRNA and Surgeon  Anesthesia Plan Comments:        Anesthesia Quick Evaluation

## 2019-05-28 NOTE — Transfer of Care (Signed)
Immediate Anesthesia Transfer of Care Note  Patient: Ariel Doyle  Procedure(s) Performed: MICRODISCECTOMY LEFT LUMBAR 2- LUMBAR 3 (Left Spine Lumbar)  Patient Location: PACU  Anesthesia Type:General  Level of Consciousness: awake  Airway & Oxygen Therapy: Patient Spontanous Breathing and Patient connected to nasal cannula oxygen  Post-op Assessment: Report given to RN, Post -op Vital signs reviewed and stable and Patient moving all extremities X 4  Post vital signs: Reviewed and stable  Last Vitals:  Vitals Value Taken Time  BP 129/73 05/28/19 1630  Temp 36.8 C 05/28/19 1630  Pulse 90 05/28/19 1636  Resp 14 05/28/19 1636  SpO2 97 % 05/28/19 1636  Vitals shown include unvalidated device data.  Last Pain:  Vitals:   05/28/19 1630  TempSrc:   PainSc: Asleep      Patients Stated Pain Goal: 3 ([id number])  Complications: No apparent anesthesia complications

## 2019-05-28 NOTE — Progress Notes (Signed)
Subjective: The patient is alert and pleasant.  She is in no apparent distress.  She looks well.  Objective: Vital signs in last 24 hours: Temp:  [98.1 F (36.7 C)-99.1 F (37.3 C)] 98.2 F (36.8 C) (02/10 1630) Pulse Rate:  [77-102] 87 (02/10 1642) Resp:  [13-20] 13 (02/10 1642) BP: (124-162)/(63-87) 124/63 (02/10 1642) SpO2:  [93 %-98 %] 97 % (02/10 1642) Weight:  [131.1 kg] 131.1 kg (02/10 1424) Estimated body mass index is 45.26 kg/m as calculated from the following:   Height as of this encounter: 5\' 7"  (1.702 m).   Weight as of this encounter: 131.1 kg.   Intake/Output from previous day: 02/09 0701 - 02/10 0700 In: 955.1 [P.O.:240; I.V.:715.1] Out: -  Intake/Output this shift: Total I/O In: 700 [I.V.:700] Out: 100 [Blood:100]  Physical exam the patient is alert and pleasant.  She is moving her lower extremities well.  Lab Results: Recent Labs    05/27/19 1915  WBC 9.0  HGB 12.8  HCT 41.6  PLT 180   BMET Recent Labs    05/27/19 1915  NA 136  K 4.1  CL 97*  CO2 27  GLUCOSE 96  BUN 16  CREATININE 0.83  CALCIUM 8.8*    Studies/Results: DG Chest 2 View  Result Date: 05/27/2019 CLINICAL DATA:  59 year old female with preop chest radiograph. EXAM: CHEST - 2 VIEW COMPARISON:  Chest radiograph dated 04/16/2017. FINDINGS: The heart size and mediastinal contours are within normal limits. Both lungs are clear. The visualized skeletal structures are unremarkable. IMPRESSION: No active cardiopulmonary disease. Electronically Signed   By: Anner Crete M.D.   On: 05/27/2019 22:32   DG Lumbar Spine 2-3 Views  Result Date: 05/28/2019 CLINICAL DATA:  L2/L3 micro discectomy EXAM: LUMBAR SPINE - 2-3 VIEW COMPARISON:  05/25/2019 FINDINGS: 3 lateral. Initial views of the lumbar spine image demonstrates are obtained instrumentation overlying the L2 vertebral body. Second image demonstrates surgical probe overlying the posterior elements of L2. Final image demonstrates  surgical instrumentation immediately posterior to the L2/L3 disc space. IMPRESSION: 1. Intraoperative evaluation of the lumbar spine as above. Electronically Signed   By: Randa Ngo M.D.   On: 05/28/2019 15:48    Assessment/Plan: The patient is doing well.  I spoke with her husband.  LOS: 0 days     Ophelia Charter 05/28/2019, 4:53 PM

## 2019-05-28 NOTE — Anesthesia Procedure Notes (Signed)
Procedure Name: Intubation Date/Time: 05/28/2019 2:54 PM Performed by: Inda Coke, CRNA Pre-anesthesia Checklist: Patient identified, Emergency Drugs available, Suction available and Patient being monitored Patient Re-evaluated:Patient Re-evaluated prior to induction Oxygen Delivery Method: Circle System Utilized Preoxygenation: Pre-oxygenation with 100% oxygen Induction Type: IV induction Ventilation: Mask ventilation without difficulty and Oral airway inserted - appropriate to patient size Laryngoscope Size: Mac and 3 Grade View: Grade I Tube type: Oral Tube size: 7.0 mm Number of attempts: 1 Airway Equipment and Method: Stylet and Oral airway Placement Confirmation: ETT inserted through vocal cords under direct vision,  positive ETCO2 and breath sounds checked- equal and bilateral Secured at: 22 cm Tube secured with: Tape Dental Injury: Teeth and Oropharynx as per pre-operative assessment

## 2019-05-28 NOTE — Anesthesia Postprocedure Evaluation (Signed)
Anesthesia Post Note  Patient: Ariel Doyle  Procedure(s) Performed: MICRODISCECTOMY LEFT LUMBAR 2- LUMBAR 3 (Left Spine Lumbar)     Patient location during evaluation: PACU Anesthesia Type: General Level of consciousness: awake and alert, patient cooperative and oriented Pain management: pain level controlled Vital Signs Assessment: post-procedure vital signs reviewed and stable Respiratory status: spontaneous breathing, nonlabored ventilation and respiratory function stable Cardiovascular status: blood pressure returned to baseline and stable Postop Assessment: no apparent nausea or vomiting Anesthetic complications: no    Last Vitals:  Vitals:   05/28/19 1741 05/28/19 1755  BP:  135/65  Pulse: 77 81  Resp: 14 18  Temp: 36.8 C 36.6 C  SpO2: 97% 94%    Last Pain:  Vitals:   05/28/19 1755  TempSrc: Oral  PainSc:                  Jaxon Mynhier,E. Osiel Stick

## 2019-05-28 NOTE — Progress Notes (Signed)
Subjective: The patient is alert and pleasant.  She continues to complain of severe left leg pain.  She wants to proceed with surgery.  Objective: Vital signs in last 24 hours: Temp:  [98.1 F (36.7 C)-99.1 F (37.3 C)] 99.1 F (37.3 C) (02/10 1352) Pulse Rate:  [77-93] 80 (02/10 1352) Resp:  [18-20] 18 (02/10 1352) BP: (135-162)/(67-87) 146/87 (02/10 1352) SpO2:  [93 %-96 %] 95 % (02/10 1352) Weight:  [131.1 kg] 131.1 kg (02/10 1424) Estimated body mass index is 45.26 kg/m as calculated from the following:   Height as of this encounter: 5\' 7"  (1.702 m).   Weight as of this encounter: 131.1 kg.   Intake/Output from previous day: 02/09 0701 - 02/10 0700 In: 955.1 [P.O.:240; I.V.:715.1] Out: -  Intake/Output this shift: No intake/output data recorded.  Physical exam the patient is alert and oriented.  She is moving her lower extremities well.  Lab Results: Recent Labs    05/25/19 1536 05/27/19 1915  WBC 8.1 9.0  HGB 13.3 12.8  HCT 42.7 41.6  PLT 277 180   BMET Recent Labs    05/25/19 1536 05/27/19 1915  NA 136 136  K 4.5 4.1  CL 96* 97*  CO2 29 27  GLUCOSE 288* 96  BUN 16 16  CREATININE 0.90 0.83  CALCIUM 9.2 8.8*    Studies/Results: DG Chest 2 View  Result Date: 05/27/2019 CLINICAL DATA:  59 year old female with preop chest radiograph. EXAM: CHEST - 2 VIEW COMPARISON:  Chest radiograph dated 04/16/2017. FINDINGS: The heart size and mediastinal contours are within normal limits. Both lungs are clear. The visualized skeletal structures are unremarkable. IMPRESSION: No active cardiopulmonary disease. Electronically Signed   By: Anner Crete M.D.   On: 05/27/2019 22:32    Assessment/Plan: Left L2-3 herniated disks, lumbar radiculopathy, lumbar spinal stenosis: I have again discussed the situation with patient.  I have answered all her questions regarding surgery.  She was to proceed with a left L2-3 discectomy, possibly bilateral.  LOS: 0 days      Ariel Doyle 05/28/2019, 2:30 PM

## 2019-05-29 ENCOUNTER — Telehealth: Payer: BC Managed Care – PPO | Admitting: Family Medicine

## 2019-05-29 DIAGNOSIS — M5116 Intervertebral disc disorders with radiculopathy, lumbar region: Secondary | ICD-10-CM | POA: Diagnosis not present

## 2019-05-29 MED ORDER — OXYCODONE-ACETAMINOPHEN 7.5-325 MG PO TABS: 1.0000 | ORAL_TABLET | ORAL | 0 refills | Status: DC | PRN

## 2019-05-29 MED ORDER — DOCUSATE SODIUM 100 MG PO CAPS: 100.0000 mg | ORAL_CAPSULE | Freq: Two times a day (BID) | ORAL | 0 refills | Status: DC

## 2019-05-29 MED ORDER — CYCLOBENZAPRINE HCL 10 MG PO TABS: 10.0000 mg | ORAL_TABLET | Freq: Three times a day (TID) | ORAL | 0 refills | Status: DC | PRN

## 2019-05-29 NOTE — Evaluation (Addendum)
Occupational Therapy Evaluation Patient Details Name: Ariel Doyle MRN: [id number] DOB: [birth year] Today's Date: 05/29/2019    History of Present Illness Pt is 59 y/o female s/p L2-3 discectomy. PMH includes L knee arthroscopy and R foot sx with arthritis.   Clinical Impression   PTA pt independent with worsening lumbar and BLE pain leading up to admission. Otherwise independent in BADL/IADL, recently retired Pharmacist, hospital. At time of eval, pt able to complete bed mobility at mod I level and transfers at supervision level with RW. Back handout provided and reviewed adls in detail. Pt educated on: clothing between brace, LB dressing, never sleep in brace, avoid sitting for long periods of time, correct bed positioning for sleeping, correct sequence for bed mobility, avoiding lifting more than 5 pounds and never wash directly over incision. Education also given on use of 3:1 as shower seat. All education is complete and patient indicates understanding. No OT f/u indicated post acute. Anticipate d/c today. OT will sign off, thank you for this consult.    Follow Up Recommendations  No OT follow up;Supervision - Intermittent    Equipment Recommendations  3 in 1 bedside commode    Recommendations for Other Services       Precautions / Restrictions Precautions Precautions: Back Precaution Booklet Issued: Yes (comment) Precaution Comments: reviewed all precautions and with BADL implications Restrictions Weight Bearing Restrictions: No Other Position/Activity Restrictions: no brace needed per orders      Mobility Bed Mobility Overal bed mobility: Modified Independent             General bed mobility comments: HOB elevated (uses wedge pillow at baseline) good technique to maintain back precautions  Transfers Overall transfer level: Needs assistance   Transfers: Sit to/from Stand Sit to Stand: Supervision         General transfer comment: S to rise and steady with RW     Balance Overall balance assessment: Mild deficits observed, not formally tested                                         ADL either performed or assessed with clinical judgement   ADL Overall ADL's : Needs assistance/impaired Eating/Feeding: Independent   Grooming: Set up;Standing   Upper Body Bathing: Set up;Sitting   Lower Body Bathing: Set up;Sit to/from stand;Sitting/lateral leans;Adhering to back precautions Lower Body Bathing Details (indicate cue type and reason): reviewed appropriate positioning on shower seat Upper Body Dressing : Set up;Sitting   Lower Body Dressing: Set up;Sit to/from stand;Adhering to back precautions Lower Body Dressing Details (indicate cue type and reason): reviewed appropriate method for back precautions and pts hx of knee pain Toilet Transfer: Supervision/safety   Toileting- Clothing Manipulation and Hygiene: Supervision/safety   Tub/ Banker: Supervision/safety;3 in Risk manager Details (indicate cue type and reason): educated on use of 3:1 in shower; has shower seat but unsure if it is still appropriate for use Functional mobility during ADLs: Supervision/safety;Rolling walker General ADL Comments: pt limited by L back pain and precautions     Vision Baseline Vision/History: No visual deficits Patient Visual Report: No change from baseline       Perception     Praxis      Pertinent Vitals/Pain Pain Assessment: Faces Faces Pain Scale: Hurts a little bit Pain Location: L2-3 sx site Pain Descriptors / Indicators: Aching Pain Intervention(s): Monitored during session;Repositioned  Hand Dominance     Extremity/Trunk Assessment Upper Extremity Assessment Upper Extremity Assessment: Overall WFL for tasks assessed   Lower Extremity Assessment Lower Extremity Assessment: Defer to PT evaluation   Cervical / Trunk Assessment Cervical / Trunk Assessment: Other exceptions Cervical / Trunk  Exceptions: recent L2-3 discectomy   Communication Communication Communication: No difficulties   Cognition Arousal/Alertness: Awake/alert Behavior During Therapy: WFL for tasks assessed/performed Overall Cognitive Status: Within Functional Limits for tasks assessed                                     General Comments       Exercises     Shoulder Instructions      Home Living Family/patient expects to be discharged to:: Private residence Living Arrangements: Spouse/significant other Available Help at Discharge: Family;Available 24 hours/day Type of Home: House Home Access: Stairs to enter [employer] of Steps: 5 Entrance Stairs-Rails: Can reach both Home Layout: One level     Bathroom Shower/Tub: Teacher, early years/pre: Standard     Home Equipment: Shower seat          Prior Functioning/Environment Level of Independence: Independent        Comments: was independent and working as Pharmacist, 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; endorses very recent difficulty with BADL 2/2 increasing leg pain        OT Problem List: Decreased knowledge of use of DME or AE;Decreased knowledge of precautions;Decreased activity tolerance;Pain      OT Treatment/Interventions:      OT Goals(Current goals can be found in the care plan section) Acute Rehab OT Goals Patient Stated Goal: go home today OT Goal Formulation: With patient Time For Goal Achievement: 06/12/19 Potential to Achieve Goals: Good  OT Frequency:     Barriers to D/C:            Co-evaluation              AM-PAC OT "6 Clicks" Daily Activity     Outcome Measure Help from another person eating meals?: None Help from another person taking care of personal grooming?: None Help from another person toileting, which includes using toliet, bedpan, or urinal?: A Little Help from another person bathing (including washing, rinsing, drying)?: A  Little Help from another person to put on and taking off regular upper body clothing?: None Help from another person to put on and taking off regular lower body clothing?: A Little 6 Click Score: 21   End of Session Equipment Utilized During Treatment: Gait belt;Rolling walker Nurse Communication: Mobility status;Precautions  Activity Tolerance: Patient tolerated treatment well Patient left: in bed;with call bell/phone within reach;Other (comment)(sitting EOB)  OT Visit Diagnosis: Other abnormalities of gait and mobility (R26.89);Pain Pain - part of body: (back)                Time: [id number] OT Time Calculation (min): 18 min Charges:  OT General Charges $OT Visit: 1 Visit OT Evaluation $OT Eval Low Complexity: 1 Low  Zenovia Jarred, MSOT, OTR/L Acute Rehabilitation Services [facility] Office Number: [phone]  Zenovia Jarred 05/29/2019, 9:13 AM

## 2019-05-29 NOTE — Progress Notes (Signed)
Patient is discharged from room 3C09 at this time. Alert and in stable condition. IV site d/c'd and instructions read to patient and spouse with understanding verbalized. Left unit via wheelchair with all belongings at side. 

## 2019-05-29 NOTE — Discharge Instructions (Signed)

## 2019-05-29 NOTE — Discharge Summary (Signed)
Physician Discharge Summary  Patient ID: Ariel Doyle MRN: [id number] DOB/AGE: 1961/01/30 59 y.o.  Admit date: 05/27/2019 Discharge date: 05/29/2019  Admission Diagnoses: Lumbar herniated disc, lumbar radiculopathy, lumbago  Discharge Diagnoses: The same Active Problems:   Lumbar herniated disc   Discharged Condition: good  Hospital Course: I performed a left L2-3 discectomy on the patient on 05/28/2019.  The surgery went well.  The patient's postoperative course was unremarkable.  On postoperative day #1 the patient requested discharge to home.  She was given written and oral discharge instructions.  All her questions were answered.  Consults: Physical therapy Significant Diagnostic Studies: None Treatments: Left L2-3 discectomy using microdissection Discharge Exam: Blood pressure 118/63, pulse 79, temperature 98 F (36.7 C), temperature source Oral, resp. rate 16, height 5\' 7"  (1.702 m), weight 131.1 kg, last menstrual period 08/22/2014, SpO2 96 %. The patient is alert and pleasant.  Her strength is normal.  She looks well.  Disposition: Home   Allergies as of 05/29/2019   No Known Allergies     Medication List    STOP taking these medications   Diclofenac Sodium CR 100 MG 24 hr tablet   predniSONE 20 MG tablet Commonly known as: DELTASONE     TAKE these medications   cetirizine 10 MG tablet Commonly known as: ZYRTEC Take 10 mg by mouth daily as needed for allergies.   cyclobenzaprine 10 MG tablet Commonly known as: FLEXERIL Take 1 tablet (10 mg total) by mouth 3 (three) times daily as needed for muscle spasms.   docusate sodium 100 MG capsule Commonly known as: COLACE Take 1 capsule (100 mg total) by mouth 2 (two) times daily.   gabapentin 300 MG capsule Commonly known as: NEURONTIN Take 1 capsule (300 mg total) by mouth 3 (three) times daily.   lansoprazole 15 MG capsule Commonly known as: PREVACID Take 15 mg by mouth daily as needed (acid reflux).   oxyCODONE-acetaminophen 7.5-325 MG tablet Commonly known as: Percocet Take 1 tablet by mouth every 4 (four) hours as needed for severe pain.   sulfaSALAzine 500 MG tablet Commonly known as: AZULFIDINE Take 2,000 mg by mouth every evening.   Voltaren 1 % Gel Generic drug: diclofenac Sodium Apply 2-4 g topically 4 (four) times daily as needed (to affected knee or other painful sites).            Durable Medical Equipment  (From admission, onward)         Start     Ordered   05/29/19 0919  For home use only DME 3 n 1  Once     05/29/19 0919           Signed: Ophelia Charter 05/29/2019, 9:17 AM

## 2019-05-29 NOTE — Evaluation (Addendum)
Physical Therapy Evaluation and Discharge Patient Details Name: Ariel Doyle MRN: [id number] DOB: Feb 19, 1961 Today's Date: 05/29/2019   History of Present Illness  Pt is 59 y/o female s/p L2-3 discectomy. PMH includes L knee arthroscopy and R foot sx with arthritis.  Clinical Impression  Pt admitted with above diagnosis. At the time of PT eval, pt was able to demonstrate transfers and ambulation with gross modified independence and RW for support. Pt was educated on precautions, appropriate activity progression, and car transfer. Pt currently with functional limitations due to the deficits listed below (see PT Problem List). Pt will benefit from skilled PT to increase their independence and safety with mobility to allow discharge to the venue listed below.      Follow Up Recommendations No PT follow up;Supervision for mobility/OOB    Equipment Recommendations  Rolling walker with 5" wheels    Recommendations for Other Services       Precautions / Restrictions Precautions Precautions: Back Precaution Booklet Issued: Yes (comment) Precaution Comments: reviewed all precautions and with BADL implications Restrictions Weight Bearing Restrictions: No Other Position/Activity Restrictions: no brace needed per orders      Mobility  Bed Mobility Overal bed mobility: Modified Independent             General bed mobility comments: pt was received sitting up EOB and was returned to EOB at end of session.   Transfers Overall transfer level: Modified independent Equipment used: Rolling walker (2 wheeled) Transfers: Sit to/from Stand Sit to Stand: Supervision         General transfer comment: Increased time to power-up to full stand however no assistance required. Pt demonstrated proper hand placement on seated surface for safety with cues for reach back to bed prior to initiating stand>sit.   Ambulation/Gait Ambulation/Gait assistance: Modified independent (Device/Increase  time);Supervision Gait Distance (Feet): 250 Feet Assistive device: Rolling walker (2 wheeled) Gait Pattern/deviations: Step-through pattern;Decreased stride length Gait velocity: Decreased Gait velocity interpretation: <1.8 ft/sec, indicate of risk for recurrent falls General Gait Details: Pt utilizing walker well without significant trunk flexion and no unsteadiness/LOB noted.   Stairs Stairs: Yes Stairs assistance: Min guard Stair Management: One rail Left;Step to pattern;Forwards Number of Stairs: 5 General stair comments: VC's for sequencing and general safety. No assist required and no knee buckling noted.   Wheelchair Mobility    Modified Rankin (Stroke Patients Only)       Balance Overall balance assessment: Mild deficits observed, not formally tested                                           Pertinent Vitals/Pain Pain Assessment: Faces Faces Pain Scale: Hurts a little bit Pain Location: L2-3 sx site Pain Descriptors / Indicators: Aching Pain Intervention(s): Limited activity within patient's tolerance;Monitored during session;Repositioned    Home Living Family/patient expects to be discharged to:: Private residence Living Arrangements: Spouse/significant other Available Help at Discharge: Family;Available 24 hours/day Type of Home: House Home Access: Stairs to enter Entrance Stairs-Rails: Can reach both Entrance Stairs-Number of Steps: 5 Home Layout: One level Home Equipment: Shower seat      Prior Function Level of Independence: Independent         Comments: was independent and working as Pharmacist, 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; endorses very recent difficulty with BADL 2/2 increasing leg pain     Hand  Dominance        Extremity/Trunk Assessment   Upper Extremity Assessment Upper Extremity Assessment: Overall WFL for tasks assessed    Lower Extremity Assessment Lower Extremity Assessment: Generalized  weakness(Consistent with pre-op diagnosis)    Cervical / Trunk Assessment Cervical / Trunk Assessment: Other exceptions Cervical / Trunk Exceptions: recent L2-3 discectomy  Communication   Communication: No difficulties  Cognition Arousal/Alertness: Awake/alert Behavior During Therapy: WFL for tasks assessed/performed Overall Cognitive Status: Within Functional Limits for tasks assessed                                        General Comments      Exercises     Assessment/Plan    PT Assessment Patent does not need any further PT services  PT Problem List         PT Treatment Interventions DME instruction;Gait training;Stair training;Functional mobility training;Therapeutic activities;Therapeutic exercise;Neuromuscular re-education;Patient/family education    PT Goals (Current goals can be found in the Care Plan section)  Acute Rehab PT Goals Patient Stated Goal: go home today PT Goal Formulation: All assessment and education complete, DC therapy    Frequency     Barriers to discharge        Co-evaluation               AM-PAC PT "6 Clicks" Mobility  Outcome Measure Help needed turning from your back to your side while in a flat bed without using bedrails?: None Help needed moving from lying on your back to sitting on the side of a flat bed without using bedrails?: None Help needed moving to and from a bed to a chair (including a wheelchair)?: None Help needed standing up from a chair using your arms (e.g., wheelchair or bedside chair)?: None Help needed to walk in hospital room?: None Help needed climbing 3-5 steps with a railing? : None 6 Click Score: 24    End of Session Equipment Utilized During Treatment: Gait belt Activity Tolerance: Patient tolerated treatment well Patient left: Other (comment)(Sitting EOB with call bell within reach) Nurse Communication: Mobility status PT Visit Diagnosis: Unsteadiness on feet (R26.81);Pain Pain -  part of body: (back)    Time: [id number] PT Time Calculation (min) (ACUTE ONLY): 15 min   Charges:   PT Evaluation $PT Eval Moderate Complexity: 1 Mod          Rolinda Roan, PT, DPT Acute Rehabilitation Services Pager: [phone] Office: [phone]   Thelma Comp 05/29/2019, 11:47 AM

## 2019-06-16 ENCOUNTER — Encounter: Payer: Self-pay | Admitting: Family Medicine

## 2019-07-22 LAB — CBC: RBC: 4.95 (ref 3.87–5.11)

## 2019-07-22 LAB — HEPATIC FUNCTION PANEL
ALT: 38 — AB (ref 7–35)
AST: 37 — AB (ref 13–35)

## 2019-07-22 LAB — BASIC METABOLIC PANEL: Creatinine: 0.4 — AB (ref 0.5–1.1)

## 2019-07-22 LAB — CBC AND DIFFERENTIAL
HCT: 43 (ref 36–46)
Hemoglobin: 13.1 (ref 12.0–16.0)
Platelets: 256 (ref 150–399)
WBC: 6.2

## 2019-08-11 LAB — CBC AND DIFFERENTIAL
HCT: 43 (ref 36–46)
Hemoglobin: 13.1 (ref 12.0–16.0)
Platelets: 256 (ref 150–399)
WBC: 6.2

## 2019-08-11 LAB — CBC: RBC: 4.95 (ref 3.87–5.11)

## 2019-08-18 NOTE — Patient Instructions (Addendum)
Preventive Care 40-57 Years Old, Female Preventive care refers to visits with your health care provider and lifestyle choices that can promote health and wellness. This includes:  A yearly physical exam. This may also be called an annual well check.  Regular dental visits and eye exams.  Immunizations.  Screening for certain conditions.  Healthy lifestyle choices, such as eating a healthy diet, getting regular exercise, not using drugs or products that contain nicotine and tobacco, and limiting alcohol use. What can I expect for my preventive care visit? Physical exam Your health care provider will check your:  Height and weight. This may be used to calculate body mass index (BMI), which tells if you are at a healthy weight.  Heart rate and blood pressure.  Skin for abnormal spots. Counseling Your health care provider may ask you questions about your:  Alcohol, tobacco, and drug use.  Emotional well-being.  Home and relationship well-being.  Sexual activity.  Eating habits.  Work and work environment.  Method of birth control.  Menstrual cycle.  Pregnancy history. What immunizations do I need?  Influenza (flu) vaccine  This is recommended every year. Tetanus, diphtheria, and pertussis (Tdap) vaccine  You may need a Td booster every 10 years. Varicella (chickenpox) vaccine  You may need this if you have not been vaccinated. Zoster (shingles) vaccine  You may need this after age 57. Measles, mumps, and rubella (MMR) vaccine  You may need at least one dose of MMR if you were born in 1957 or later. You may also need a second dose. Pneumococcal conjugate (PCV13) vaccine  You may need this if you have certain conditions and were not previously vaccinated. Pneumococcal polysaccharide (PPSV23) vaccine  You may need one or two doses if you smoke cigarettes or if you have certain conditions. Meningococcal conjugate (MenACWY) vaccine  You may need this if you  have certain conditions. Hepatitis A vaccine  You may need this if you have certain conditions or if you travel or work in places where you may be exposed to hepatitis A. Hepatitis B vaccine  You may need this if you have certain conditions or if you travel or work in places where you may be exposed to hepatitis B. Haemophilus influenzae type b (Hib) vaccine  You may need this if you have certain conditions. Human papillomavirus (HPV) vaccine  If recommended by your health care provider, you may need three doses over 6 months. You may receive vaccines as individual doses or as more than one vaccine together in one shot (combination vaccines). Talk with your health care provider about the risks and benefits of combination vaccines. What tests do I need? Blood tests  Lipid and cholesterol levels. These may be checked every 5 years, or more frequently if you are over 50 years old.  Hepatitis C test.  Hepatitis B test. Screening  Lung cancer screening. You may have this screening every year starting at age 55 if you have a 30-pack-year history of smoking and currently smoke or have quit within the past 15 years.  Colorectal cancer screening. All adults should have this screening starting at age 50 and continuing until age 75. Your health care provider may recommend screening at age 45 if you are at increased risk. You will have tests every 1-10 years, depending on your results and the type of screening test.  Diabetes screening. This is done by checking your blood sugar (glucose) after you have not eaten for a while (fasting). You may have this   done every 1-3 years.  Mammogram. This may be done every 1-2 years. Talk with your health care provider about when you should start having regular mammograms. This may depend on whether you have a family history of breast cancer.  BRCA-related cancer screening. This may be done if you have a family history of breast, ovarian, tubal, or peritoneal  cancers.  Pelvic exam and Pap test. This may be done every 3 years starting at age 57. Starting at age 57, this may be done every 5 years if you have a Pap test in combination with an HPV test. Other tests  Sexually transmitted disease (STD) testing.  Bone density scan. This is done to screen for osteoporosis. You may have this scan if you are at high risk for osteoporosis. Follow these instructions at home: Eating and drinking  Eat a diet that includes fresh fruits and vegetables, whole grains, lean protein, and low-fat dairy.  Take vitamin and mineral supplements as recommended by your health care provider.  Do not drink alcohol if: ? Your health care provider tells you not to drink. ? You are pregnant, may be pregnant, or are planning to become pregnant.  If you drink alcohol: ? Limit how much you have to 0-1 drink a day. ? Be aware of how much alcohol is in your drink. In the [place] one drink equals one 12 oz bottle of beer (355 mL), one 5 oz glass of wine (148 mL), or one 1 oz glass of hard liquor (44 mL). Lifestyle  Take daily care of your teeth and gums.  Stay active. Exercise for at least 30 minutes on 5 or more days each week.  Do not use any products that contain nicotine or tobacco, such as cigarettes, e-cigarettes, and chewing tobacco. If you need help quitting, ask your health care provider.  If you are sexually active, practice safe sex. Use a condom or other form of birth control (contraception) in order to prevent pregnancy and STIs (sexually transmitted infections).  If told by your health care provider, take low-dose aspirin daily starting at age 57. What's next?  Visit your health care provider once a year for a well check visit.  Ask your health care provider how often you should have your eyes and teeth checked.  Stay up to date on all vaccines. This information is not intended to replace advice given to you by your health care provider. Make sure you  discuss any questions you have with your health care provider. Document Revised: 12/13/2017 Document Reviewed: 12/13/2017 Elsevier Patient Education  2020 [employer]  Preventive Care 58-78 Years Old, Female Preventive care refers to visits with your health care provider and lifestyle choices that can promote health and wellness. This includes:  A yearly physical exam. This may also be called an annual well check.  Regular dental visits and eye exams.  Immunizations.  Screening for certain conditions.  Healthy lifestyle choices, such as eating a healthy diet, getting regular exercise, not using drugs or products that contain nicotine and tobacco, and limiting alcohol use. What can I expect for my preventive care visit? Physical exam Your health care provider will check your:  Height and weight. This may be used to calculate body mass index (BMI), which tells if you are at a healthy weight.  Heart rate and blood pressure.  Skin for abnormal spots. Counseling Your health care provider may ask you questions about your:  Alcohol, tobacco, and drug use.  Emotional well-being.  Home and  relationship well-being.  Sexual activity.  Eating habits.  Work and work Statistician.  Method of birth control.  Menstrual cycle.  Pregnancy history. What immunizations do I need?  Influenza (flu) vaccine  This is recommended every year. Tetanus, diphtheria, and pertussis (Tdap) vaccine  You may need a Td booster every 10 years. Varicella (chickenpox) vaccine  You may need this if you have not been vaccinated. Zoster (shingles) vaccine  You may need this after age 67. Measles, mumps, and rubella (MMR) vaccine  You may need at least one dose of MMR if you were born in 1957 or later. You may also need a second dose. Pneumococcal conjugate (PCV13) vaccine  You may need this if you have certain conditions and were not previously vaccinated. Pneumococcal polysaccharide  (PPSV23) vaccine  You may need one or two doses if you smoke cigarettes or if you have certain conditions. Meningococcal conjugate (MenACWY) vaccine  You may need this if you have certain conditions. Hepatitis A vaccine  You may need this if you have certain conditions or if you travel or work in places where you may be exposed to hepatitis A. Hepatitis B vaccine  You may need this if you have certain conditions or if you travel or work in places where you may be exposed to hepatitis B. Haemophilus influenzae type b (Hib) vaccine  You may need this if you have certain conditions. Human papillomavirus (HPV) vaccine  If recommended by your health care provider, you may need three doses over 6 months. You may receive vaccines as individual doses or as more than one vaccine together in one shot (combination vaccines). Talk with your health care provider about the risks and benefits of combination vaccines. What tests do I need? Blood tests  Lipid and cholesterol levels. These may be checked every 5 years, or more frequently if you are over 4 years old.  Hepatitis C test.  Hepatitis B test. Screening  Lung cancer screening. You may have this screening every year starting at age 39 if you have a 30-pack-year history of smoking and currently smoke or have quit within the past 15 years.  Colorectal cancer screening. All adults should have this screening starting at age 65 and continuing until age 35. Your health care provider may recommend screening at age 67 if you are at increased risk. You will have tests every 1-10 years, depending on your results and the type of screening test.  Diabetes screening. This is done by checking your blood sugar (glucose) after you have not eaten for a while (fasting). You may have this done every 1-3 years.  Mammogram. This may be done every 1-2 years. Talk with your health care provider about when you should start having regular mammograms. This may  depend on whether you have a family history of breast cancer.  BRCA-related cancer screening. This may be done if you have a family history of breast, ovarian, tubal, or peritoneal cancers.  Pelvic exam and Pap test. This may be done every 3 years starting at age 57. Starting at age 46, this may be done every 5 years if you have a Pap test in combination with an HPV test. Other tests  Sexually transmitted disease (STD) testing.  Bone density scan. This is done to screen for osteoporosis. You may have this scan if you are at high risk for osteoporosis. Follow these instructions at home: Eating and drinking  Eat a diet that includes fresh fruits and vegetables, whole grains, lean protein, and  low-fat dairy.  Take vitamin and mineral supplements as recommended by your health care provider.  Do not drink alcohol if: ? Your health care provider tells you not to drink. ? You are pregnant, may be pregnant, or are planning to become pregnant.  If you drink alcohol: ? Limit how much you have to 0-1 drink a day. ? Be aware of how much alcohol is in your drink. In the [place] one drink equals one 12 oz bottle of beer (355 mL), one 5 oz glass of wine (148 mL), or one 1 oz glass of hard liquor (44 mL). Lifestyle  Take daily care of your teeth and gums.  Stay active. Exercise for at least 30 minutes on 5 or more days each week.  Do not use any products that contain nicotine or tobacco, such as cigarettes, e-cigarettes, and chewing tobacco. If you need help quitting, ask your health care provider.  If you are sexually active, practice safe sex. Use a condom or other form of birth control (contraception) in order to prevent pregnancy and STIs (sexually transmitted infections).  If told by your health care provider, take low-dose aspirin daily starting at age 52. What's next?  Visit your health care provider once a year for a well check visit.  Ask your health care provider how often you should  have your eyes and teeth checked.  Stay up to date on all vaccines. This information is not intended to replace advice given to you by your health care provider. Make sure you discuss any questions you have with your health care provider. Document Revised: 12/13/2017 Document Reviewed: 12/13/2017 Elsevier Patient Education  2020 [employer].

## 2019-08-18 NOTE — Progress Notes (Signed)
Complete physical exam   Patient: Ariel Doyle   DOB: 22-Jun-1960   59 y.o. Female  MRN: [id number] Visit Date: 08/19/2019  I,Sulibeya S Dimas,acting as a scribe for Lavon Paganini, MD.,have documented all relevant documentation on the behalf of Lavon Paganini, MD,as directed by  Lavon Paganini, MD while in the presence of Lavon Paganini, MD.  Today's healthcare provider: Lavon Paganini, MD   Chief Complaint  Patient presents with  . Annual Exam   Subjective    Ariel Doyle is a 59 y.o. female who presents today for a complete physical exam.  She reports consuming a general diet. The patient does not participate in regular exercise at present. She generally feels well. She reports sleeping well. She does have additional problems to discuss today.  HPI  Patient C/O left knee pain x several years. Patient reports that she had left knee surgery about 5 years ago Dr. Colen Darling, jDr. Swinteck. Patient reports that surgery did not help with pain. Patient reports she does see Dr. Adonis Brook for Psoriatic arthritis.  Feels like she needs a knee replacement, but knows she will need to lose weight first.  Wondering about [facility] Pre-[facility] program.  Past Medical History:  Diagnosis Date  . Arthritis    Past Surgical History:  Procedure Laterality Date  . FOOT SURGERY Right    Buninectomy and toes straightened  . FOOT SURGERY Right    2nd  repair of toes that were previously operated on  . FOOT SURGERY Right     a pin removed  . KNEE ARTHROSCOPY Left    meniscus tear  . LUMBAR LAMINECTOMY/DECOMPRESSION MICRODISCECTOMY Left 05/28/2019   Procedure: MICRODISCECTOMY LEFT LUMBAR 2- LUMBAR 3;  Surgeon: Newman Pies, MD;  Location: [facility];  Service: Neurosurgery;  Laterality: Left;  MICRODISCECTOMY LEFT LUMBAR 2- LUMBAR 3  . TUBAL LIGATION     Social History   Socioeconomic History  . Marital status: Married    Spouse name: Not on file  . Number  of children: Not on file  . Years of education: Not on file  . Highest education level: Not on file  Occupational History  . Not on file  Tobacco Use  . Smoking status: Never Smoker  . Smokeless tobacco: Never Used  Substance and Sexual Activity  . Alcohol use: No  . Drug use: No  . Sexual activity: Not on file  Other Topics Concern  . Not on file  Social History Narrative  . Not on file   Social Determinants of Health   Financial Resource Strain:   . Difficulty of Paying Living Expenses:   Food Insecurity:   . Worried About Charity fundraiser in the Last Year:   . Arboriculturist in the Last Year:   Transportation Needs:   . Film/video editor (Medical):   Marland Kitchen Lack of Transportation (Non-Medical):   Physical Activity:   . Days of Exercise per Week:   . Minutes of Exercise per Session:   Stress:   . Feeling of Stress :   Social Connections:   . Frequency of Communication with Friends and Family:   . Frequency of Social Gatherings with Friends and Family:   . Attends Religious Services:   . Active Member of Clubs or Organizations:   . Attends Archivist Meetings:   Marland Kitchen Marital Status:   Intimate Partner Violence:   . Fear of Current or Ex-Partner:   . Emotionally Abused:   .  Physically Abused:   . Sexually Abused:    Family Status  Relation Name Status  . Mother  Alive  . Father  Alive   Family History  Problem Relation Age of Onset  . Breast cancer Mother   . Diabetes Father   . Heart attack Father    No Known Allergies  Patient Care Team: Virginia Crews, MD as PCP - General (Family Medicine)   Medications: Outpatient Medications Prior to Visit  Medication Sig  . cetirizine (ZYRTEC) 10 MG tablet Take 10 mg by mouth daily as needed for allergies.  Marland Kitchen diclofenac Sodium (VOLTAREN) 1 % GEL Apply 2-4 g topically 4 (four) times daily as needed (to affected knee or other painful sites).   . Diclofenac Sodium CR 100 MG 24 hr tablet TAKE ONE  TABLET BY MOUTH DAILY  . gabapentin (NEURONTIN) 300 MG capsule Take 1 capsule (300 mg total) by mouth 3 (three) times daily.  . lansoprazole (PREVACID) 15 MG capsule Take 15 mg by mouth daily as needed (acid reflux).   Marland Kitchen sulfaSALAzine (AZULFIDINE) 500 MG tablet Take 2,000 mg by mouth every evening.   . [DISCONTINUED] cyclobenzaprine (FLEXERIL) 10 MG tablet Take 1 tablet (10 mg total) by mouth 3 (three) times daily as needed for muscle spasms. (Patient not taking: Reported on 08/19/2019)  . [DISCONTINUED] docusate sodium (COLACE) 100 MG capsule Take 1 capsule (100 mg total) by mouth 2 (two) times daily. (Patient not taking: Reported on 08/19/2019)  . [DISCONTINUED] oxyCODONE-acetaminophen (PERCOCET) 7.5-325 MG tablet Take 1 tablet by mouth every 4 (four) hours as needed for severe pain. (Patient not taking: Reported on 08/19/2019)   No facility-administered medications prior to visit.    Review of Systems  Constitutional: Negative.   HENT: Negative.   Eyes: Negative.   Respiratory: Negative.   Cardiovascular: Negative.   Gastrointestinal: Negative.   Endocrine: Negative.   Genitourinary: Negative.   Musculoskeletal: Positive for arthralgias, back pain and gait problem. Negative for joint swelling, myalgias, neck pain and neck stiffness.  Skin: Negative.   Allergic/Immunologic: Negative.   Hematological: Negative.   Psychiatric/Behavioral: Negative.       Objective    BP (!) 141/73 (BP Location: Right Arm, Patient Position: Sitting, Cuff Size: Normal)   Pulse 88   Temp (!) 96.9 F (36.1 C) (Temporal)   Resp 16   Ht 5\' 7"  (1.702 m)   Wt (!) 302 lb (137 kg)   LMP 08/22/2014 Comment: irregular  BMI 47.30 kg/m    Physical Exam Vitals reviewed. Exam conducted with a chaperone present.  Constitutional:      General: She is not in acute distress.    Appearance: Normal appearance. She is well-developed. She is not diaphoretic.  HENT:     Head: Normocephalic and atraumatic.     Right  Ear: Tympanic membrane, ear canal and external ear normal.     Left Ear: Tympanic membrane, ear canal and external ear normal.  Eyes:     General: No scleral icterus.    Conjunctiva/sclera: Conjunctivae normal.     Pupils: Pupils are equal, round, and reactive to light.  Neck:     Thyroid: No thyromegaly.  Cardiovascular:     Rate and Rhythm: Normal rate and regular rhythm.     Pulses: Normal pulses.     Heart sounds: Normal heart sounds. No murmur.  Pulmonary:     Effort: Pulmonary effort is normal. No respiratory distress.     Breath sounds: Normal breath sounds. No  wheezing or rales.  Abdominal:     General: There is no distension.     Palpations: Abdomen is soft.     Tenderness: There is no abdominal tenderness. There is no guarding or rebound.  Genitourinary:    Exam position: Supine.     Comments: GYN:  External genitalia within normal limits.  Vaginal mucosa pink, moist, normal rugae.  Nonfriable cervix without lesions, no discharge or bleeding noted on speculum exam.  Bimanual exam revealed normal, nongravid uterus.  No cervical motion tenderness. No adnexal masses bilaterally.    Musculoskeletal:        General: No deformity.     Cervical back: Neck supple.     Right lower leg: No edema.     Left lower leg: No edema.  Lymphadenopathy:     Cervical: No cervical adenopathy.  Skin:    General: Skin is warm and dry.     Findings: No rash.  Neurological:     Mental Status: She is alert and oriented to person, place, and time. Mental status is at baseline.     Gait: Gait normal.  Psychiatric:        Mood and Affect: Mood normal.        Behavior: Behavior normal.        Thought Content: Thought content normal.     Depression Screen  PHQ 2/9 Scores 08/19/2019 05/26/2019  PHQ - 2 Score 0 0  PHQ- 9 Score 0 0    Results for orders placed or performed in visit on 08/19/19  [facility] HEPATITIS C SCREENING LAB  Result Value Ref Range   HM Hepatitis Screen Negative-Validated   CBC  and differential  Result Value Ref Range   Hemoglobin 13.1 12.0 - 16.0   HCT 43 36 - 46   Platelets 256 150 - 399   WBC 6.2   CBC  Result Value Ref Range   RBC 4.95 3.87 - [id number]  Basic metabolic panel  Result Value Ref Range   Creatinine 0.4 (A) 0.5 - 1.1  Hepatic function panel  Result Value Ref Range   ALT 38 (A) 7 - 35   AST 37 (A) 13 - 35  TSH  Result Value Ref Range   TSH 2.45 0.41 - 5.90  CBC and differential  Result Value Ref Range   Hemoglobin 13.1 12.0 - 16.0   HCT 43 36 - 46   Platelets 256 150 - 399   WBC 6.2   CBC  Result Value Ref Range   RBC 4.95 3.87 - 5.11    Assessment & Plan    Routine Health Maintenance and Physical Exam  Exercise Activities and Dietary recommendations Goals   None     Immunization History  Administered Date(s) Administered  . Influenza, Seasonal, Injecte, Preservative Fre 02/23/2016, 02/07/2017  . Influenza,inj,Quad PF,6+ Mos 01/20/2019  . Influenza-Unspecified 01/16/2018  . PFIZER SARS-COV-2 Vaccination 04/21/2019, 05/12/2019  . Pneumococcal Conjugate-13 08/19/2019  . Tdap 02/22/2010  . Zoster Recombinat (Shingrix) 08/19/2019    Health Maintenance  Topic Date Due  . PAP SMEAR-Modifier  Never done  . MAMMOGRAM  Never done  . COLONOSCOPY  Never done  . INFLUENZA VACCINE  11/16/2019  . TETANUS/TDAP  02/23/2020  . COVID-19 Vaccine  Completed  . Hepatitis C Screening  Completed  . HIV Screening  Completed    Discussed health benefits of physical activity, and encouraged her to engage in regular exercise appropriate for her age and condition.  Problem List Items  Addressed This Visit      Musculoskeletal and Integument   Psoriatic arthritis ([place])    Chronic and stable Followed by [facility]      Relevant Medications   Diclofenac Sodium CR 100 MG 24 hr tablet     Other   Chronic pain of left knee    Referral made to [facility] prehab program      Relevant Medications   Diclofenac Sodium CR 100 MG 24 hr tablet     Other Relevant Orders   AMB referral to orthopedics   Hyperglycemia    May have been due to steroids Check A1c      Relevant Orders   Hemoglobin A1c   Morbid obesity ([place])    Discussed importance of healthy weight management Discussed diet and exercise       Relevant Orders   Lipid Panel With LDL/HDL Ratio   Hemoglobin A1c    Other Visit Diagnoses    Annual physical exam    -  Primary   Relevant Orders   Lipid Panel With LDL/HDL Ratio   Ambulatory referral to Gastroenterology   Cervical cancer screening       Relevant Orders   Cytology - PAP   Encounter for screening mammogram for malignant neoplasm of breast       Relevant Orders   MM 3D SCREEN BREAST BILATERAL   Colon cancer screening       Relevant Orders   Ambulatory referral to Gastroenterology   Need for shingles vaccine       Relevant Orders   Varicella-zoster vaccine IM (Completed)   Need for Streptococcus pneumoniae vaccination       Relevant Orders   Pneumococcal conjugate vaccine 13-valent IM (Completed)   Immunosuppression ([place])       Relevant Orders   Pneumococcal conjugate vaccine 13-valent IM (Completed)     Return in about 1 year (around 08/18/2020) for CPE.  I, Lavon Paganini, MD, have reviewed all documentation for this visit. The documentation on 08/19/19 for the exam, diagnosis, procedures, and orders are all accurate and complete.   Affie Gasner, Dionne Bucy, MD, MPH [facility]

## 2019-08-19 ENCOUNTER — Ambulatory Visit (INDEPENDENT_AMBULATORY_CARE_PROVIDER_SITE_OTHER): Payer: BC Managed Care – PPO | Admitting: Family Medicine

## 2019-08-19 ENCOUNTER — Other Ambulatory Visit: Payer: Self-pay

## 2019-08-19 ENCOUNTER — Other Ambulatory Visit (HOSPITAL_COMMUNITY)
Admission: RE | Admit: 2019-08-19 | Discharge: 2019-08-19 | Disposition: A | Discharge status: Home / Self Care | Payer: BC Managed Care – PPO | Source: Ambulatory Visit | Attending: Family Medicine | Admitting: Family Medicine

## 2019-08-19 ENCOUNTER — Encounter: Payer: Self-pay | Admitting: Family Medicine

## 2019-08-19 VITALS — BP 141/73 | HR 88 | Temp 96.9°F | Resp 16 | Ht 67.0 in | Wt 302.0 lb

## 2019-08-19 DIAGNOSIS — Z1211 Encounter for screening for malignant neoplasm of colon: Secondary | ICD-10-CM | POA: Diagnosis not present

## 2019-08-19 DIAGNOSIS — Z23 Encounter for immunization: Secondary | ICD-10-CM

## 2019-08-19 DIAGNOSIS — D849 Immunodeficiency, unspecified: Secondary | ICD-10-CM

## 2019-08-19 DIAGNOSIS — Z0000 Encounter for general adult medical examination without abnormal findings: Secondary | ICD-10-CM | POA: Diagnosis not present

## 2019-08-19 DIAGNOSIS — G8929 Other chronic pain: Secondary | ICD-10-CM

## 2019-08-19 DIAGNOSIS — Z1231 Encounter for screening mammogram for malignant neoplasm of breast: Secondary | ICD-10-CM

## 2019-08-19 DIAGNOSIS — Z124 Encounter for screening for malignant neoplasm of cervix: Secondary | ICD-10-CM | POA: Insufficient documentation

## 2019-08-19 DIAGNOSIS — R739 Hyperglycemia, unspecified: Secondary | ICD-10-CM

## 2019-08-19 DIAGNOSIS — M25562 Pain in left knee: Secondary | ICD-10-CM

## 2019-08-19 DIAGNOSIS — L4050 Arthropathic psoriasis, unspecified: Secondary | ICD-10-CM

## 2019-08-19 NOTE — Assessment & Plan Note (Signed)
Discussed importance of healthy weight management Discussed diet and exercise  

## 2019-08-19 NOTE — Assessment & Plan Note (Signed)
May have been due to steroids Check A1c

## 2019-08-19 NOTE — Assessment & Plan Note (Signed)
Chronic and stable Followed by [facility]

## 2019-08-19 NOTE — Assessment & Plan Note (Signed)
Referral made to [facility] program

## 2019-08-20 ENCOUNTER — Other Ambulatory Visit: Payer: Self-pay

## 2019-08-20 DIAGNOSIS — E1165 Type 2 diabetes mellitus with hyperglycemia: Secondary | ICD-10-CM

## 2019-08-20 LAB — LIPID PANEL WITH LDL/HDL RATIO
Cholesterol, Total: 215 mg/dL — ABNORMAL HIGH (ref 100–199)
HDL: 53 mg/dL (ref >39)
LDL Chol Calc (NIH): 142 mg/dL — ABNORMAL HIGH (ref 0–99)
LDL/HDL Ratio: 2.7 ratio (ref 0.0–3.2)
Triglycerides: 110 mg/dL (ref 0–149)
VLDL Cholesterol Cal: 20 mg/dL (ref 5–40)

## 2019-08-20 LAB — HEMOGLOBIN A1C
Est. average glucose Bld gHb Est-mCnc: 192 mg/dL
Hgb A1c MFr Bld: 8.3 % — ABNORMAL HIGH (ref 4.8–5.6)

## 2019-08-20 MED ORDER — ONETOUCH ULTRA 2 W/DEVICE KIT: PACK | 0 refills | Status: AC

## 2019-08-20 MED ORDER — ONETOUCH DELICA LANCETS 33G MISC: 3 refills | Status: AC

## 2019-08-20 MED ORDER — ONETOUCH ULTRA VI STRP: ORAL_STRIP | 12 refills | Status: AC

## 2019-08-20 MED ORDER — METFORMIN HCL 500 MG PO TABS: 500.0000 mg | ORAL_TABLET | Freq: Two times a day (BID) | ORAL | 3 refills | Status: DC

## 2019-08-20 NOTE — Telephone Encounter (Signed)
-----   Message from Virginia Crews, MD sent at 08/20/2019  9:23 AM EDT ----- Blood sugar is higher than we realized.  A1c is well into the diabetic range.  Average blood sugar is 192.  Prednisone may have contributed, but regardless, likely need to treat.  Recommend starting Metformin 500mg  twice daily to decrease blood sugar.  Recommend cutting back on carbohydrates in the diet.  We should follow-up in 3 months and repeat A1c.  Cholesterol is also high in the setting of diabetes.  May need to discuss a medication to lower this and lower heart disease and stroke risk.  Can discuss at the next visit.

## 2019-08-20 NOTE — Telephone Encounter (Signed)
Patient advised as below. Patient verbalizes understanding and is in agreement with treatment plan.  

## 2019-08-20 NOTE — Telephone Encounter (Signed)
Meter, test strips and lancets sent to pharmacy

## 2019-08-20 NOTE — Telephone Encounter (Signed)
Patient advised as below.  

## 2019-08-20 NOTE — Telephone Encounter (Signed)
Ariel Doyle had discussed today about being put on Metformin to decrease blood sugars, as her daddy is a diabetic she is aware of the importance of a monitoring system and wants nurse to reach back out to her today on her cell as to getting a monitor, strips, etc as to make sure she does not dip too low. Pls FU back up. Pt is rather frustrated as was up all nite with mom in ER.

## 2019-08-21 ENCOUNTER — Telehealth: Payer: Self-pay

## 2019-08-21 LAB — CYTOLOGY - PAP
Comment: NEGATIVE
Comment: NEGATIVE
Diagnosis: NEGATIVE
HPV 16: NEGATIVE
HPV 18 / 45: NEGATIVE
High risk HPV: POSITIVE — AB

## 2019-08-21 NOTE — Telephone Encounter (Signed)
-----   Message from Virginia Crews, MD sent at 08/21/2019  2:55 PM EDT ----- Normal pap smear, but HPV positive.  Needs repeat pap smear in 1 year

## 2019-08-21 NOTE — Telephone Encounter (Signed)
Patient advised as below. Patient verbalizes understanding and is in agreement with treatment plan.  

## 2019-10-22 ENCOUNTER — Ambulatory Visit: Payer: Self-pay | Admitting: Family Medicine

## 2019-10-23 ENCOUNTER — Ambulatory Visit (INDEPENDENT_AMBULATORY_CARE_PROVIDER_SITE_OTHER): Payer: BC Managed Care – PPO

## 2019-10-23 ENCOUNTER — Other Ambulatory Visit: Payer: Self-pay

## 2019-10-23 VITALS — Temp 96.9°F

## 2019-10-23 DIAGNOSIS — Z23 Encounter for immunization: Secondary | ICD-10-CM

## 2019-10-23 NOTE — Progress Notes (Signed)
     Established patient visit   Patient: Ariel Doyle   DOB: 1961-02-03   59 y.o. Female  MRN: [id number] Visit Date: 10/23/2019  Today's healthcare provider: Minette Headland, [place]   Chief Complaint  Patient presents with  . Immunizations   Subjective    HPI  Patient comes into office today to update immunization(s), patient reports that they feel well today and have no complaints or concerns to address. Immunization record has been reviewed with patient and information handout in regards to vaccine has been given. Patient was observed after injection and tolerated well with no adverse reaction or side effects.    Immunization History  Administered Date(s) Administered  . Influenza, Seasonal, Injecte, Preservative Fre 02/23/2016, 02/07/2017  . Influenza,inj,Quad PF,6+ Mos 01/20/2019  . Influenza-Unspecified 01/16/2018  . PFIZER SARS-COV-2 Vaccination 04/21/2019, 05/12/2019  . Pneumococcal Conjugate-13 08/19/2019  . Pneumococcal Polysaccharide-23 10/23/2019  . Tdap 02/22/2010  . Zoster Recombinat (Shingrix) 08/19/2019, 10/23/2019    Patient Active Problem List   Diagnosis Date Noted  . Hyperglycemia 08/19/2019  . Morbid obesity ([place]) 08/19/2019  . 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 Psoriatic arthritis ([place]) 05/21/2019  . Chronic pain of left knee 03/30/2015  . DDD (degenerative disc disease), lumbosacral 08/13/2014  . 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 Allergic rhinitis 06/26/2009   Past Medical History:  Diagnosis Date  . Arthritis    Social History   Tobacco Use  . Smoking status: Never Smoker  . Smokeless tobacco: Never Used  Vaping Use  . Vaping Use: Never used  Substance Use Topics  . Alcohol use: No  . Drug use: No   No Known Allergies     Medications: Outpatient Medications Prior to Visit  Medication Sig  . Blood Glucose Monitoring Suppl (ONE TOUCH ULTRA 2) w/Device KIT Use as directed to check blood glucose 1-4 times daily  . cetirizine  (ZYRTEC) 10 MG tablet Take 10 mg by mouth daily as needed for allergies.  Marland Kitchen diclofenac Sodium (VOLTAREN) 1 % GEL Apply 2-4 g topically 4 (four) times daily as needed (to affected knee or other painful sites).   . Diclofenac Sodium CR 100 MG 24 hr tablet TAKE ONE TABLET BY MOUTH DAILY  . gabapentin (NEURONTIN) 300 MG capsule Take 1 capsule (300 mg total) by mouth 3 (three) times daily.  Marland Kitchen glucose blood (ONETOUCH ULTRA) test strip Use as directed to check blood glucose 1-4 times daily  . lansoprazole (PREVACID) 15 MG capsule Take 15 mg by mouth daily as needed (acid reflux).   . metFORMIN (GLUCOPHAGE) 500 MG tablet Take 1 tablet (500 mg total) by mouth 2 (two) times daily with a meal.  . OneTouch Delica Lancets 83F MISC Use as directed to check blood glucose 1-4 times daily  . sulfaSALAzine (AZULFIDINE) 500 MG tablet Take 2,000 mg by mouth every evening.    No facility-administered medications prior to visit.    Review of Systems    Objective    Temp (!) 96.9 F (36.1 C) (Oral)   LMP 08/22/2014 Comment: irregular   Physical Exam   No results found for any visits on 10/23/19.  Assessment & Plan       No follow-ups on file.         Minette Headland, [employer] [phone] (phone) [phone] (fax)  [place]

## 2019-11-20 ENCOUNTER — Ambulatory Visit: Payer: Self-pay | Admitting: Family Medicine

## 2019-11-20 ENCOUNTER — Ambulatory Visit: Payer: Self-pay | Admitting: Adult Health

## 2019-12-08 ENCOUNTER — Encounter: Payer: Self-pay | Admitting: Family Medicine

## 2020-06-21 IMAGING — MR MR HIP*L* W/O CM
6 series · 34 of 40 positions shown · non-contrast
Comparison: Radiography 05/21/2019

CLINICAL DATA: Left hip pain worsening over the last few days.

EXAM:
MR OF THE LEFT HIP WITHOUT CONTRAST
TECHNIQUE: Multiplanar, multisequence MR imaging was performed. No intravenous
contrast was administered.

[Series 2: T1 · coronal · left · 4.0mm · 0.50mm/px · 2 of 42 slices shown]
[im 1/42]
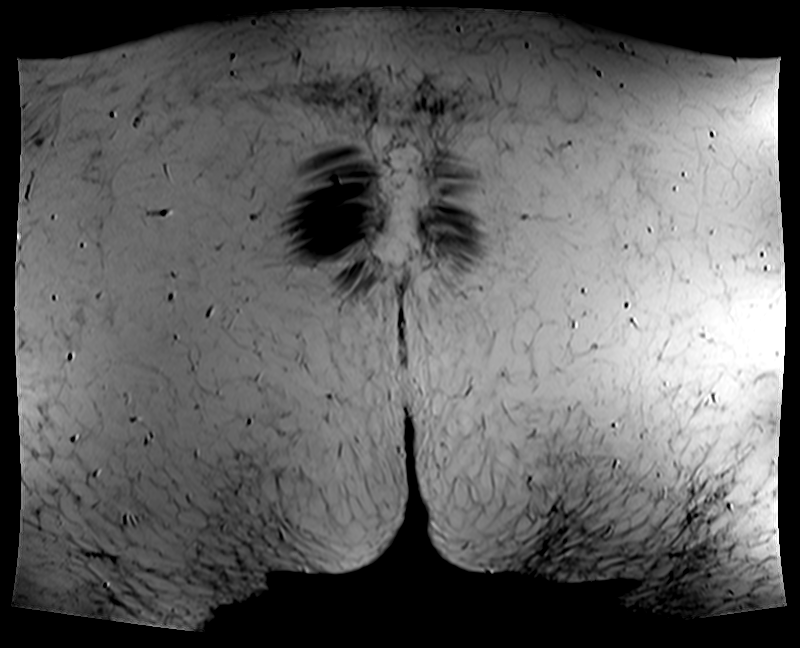
[im 6/42]
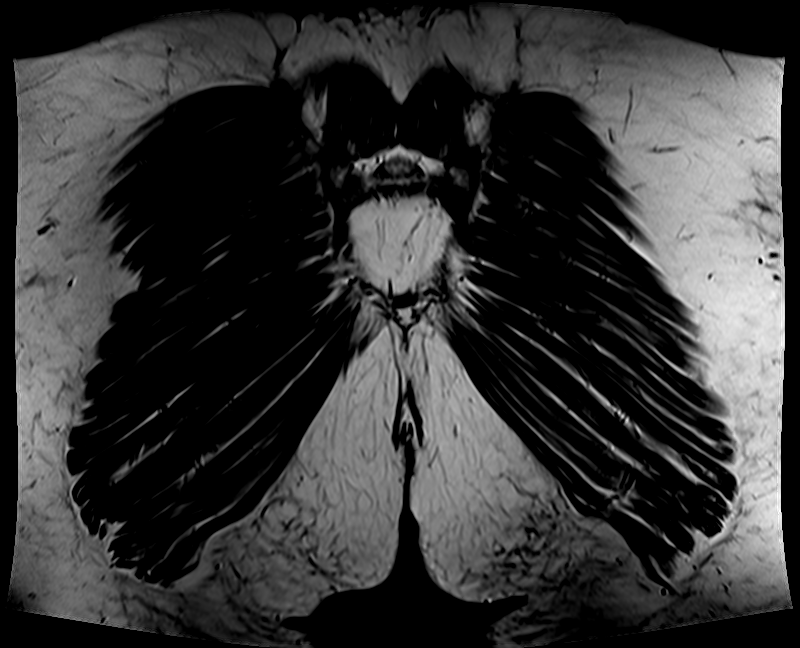

[Series 3: T2 fat-sat · coronal · left · 4.0mm · 1.25mm/px · 8 of 42 slices shown (1 of 2)]
[im 1/42]
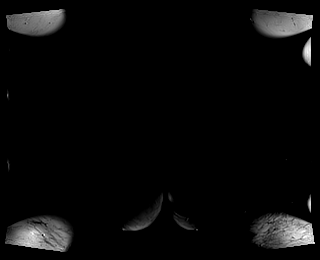
[im 6/42]
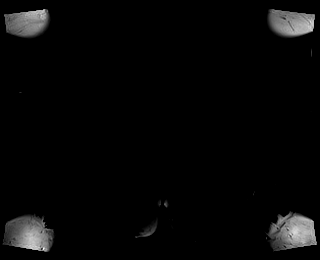
[im 12/42]
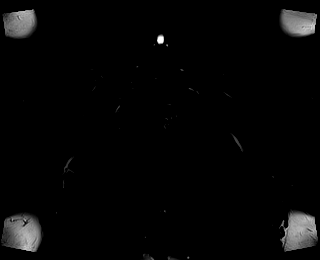
[im 18/42]
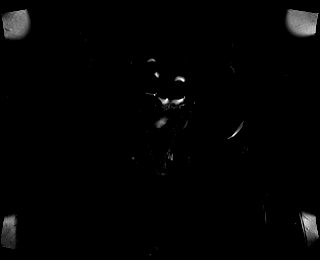
[im 24/42]
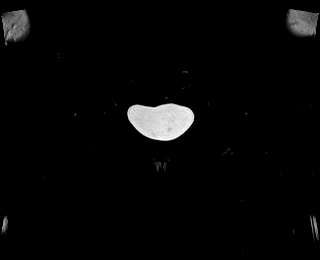
[im 30/42]
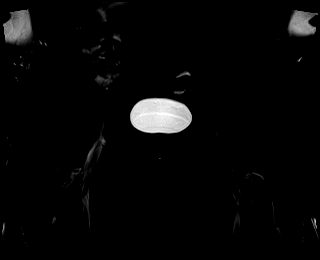
[im 36/42]
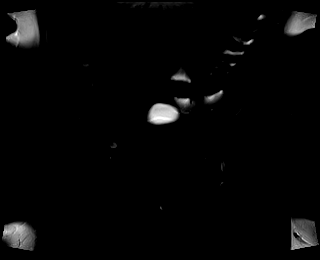
[im 42/42]
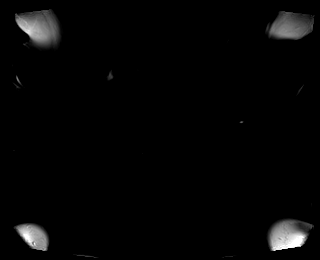

[Series 4: T2 fat-sat · axial · left · 4.0mm · 0.39mm/px · z∈[-31,+114]mm · 6 of 30 slices shown (2 of 2)]
[im 1/30]
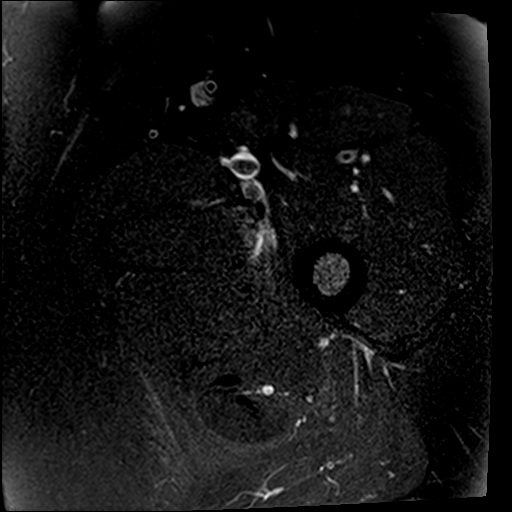
[im 6/30]
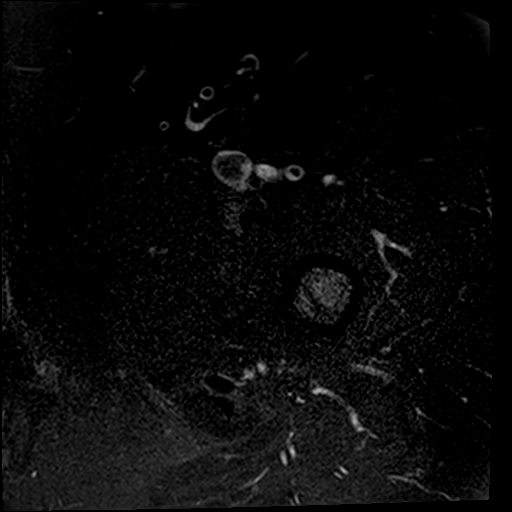
[im 12/30]
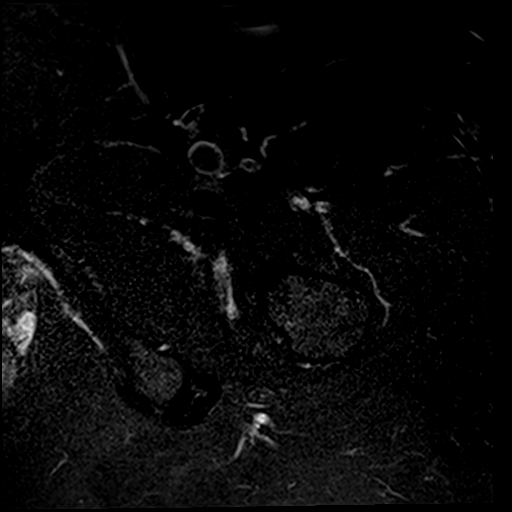
[im 18/30]
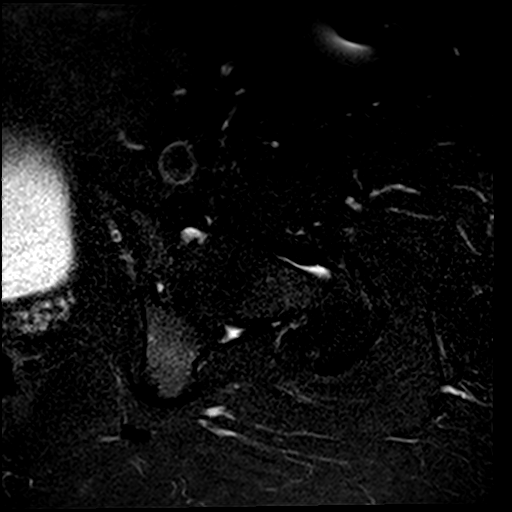
[im 24/30]
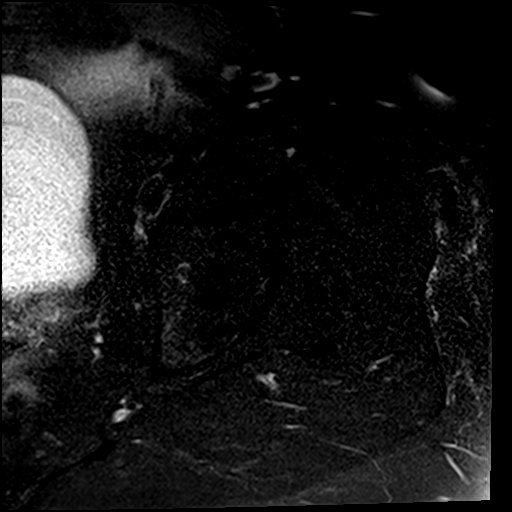
[im 30/30]
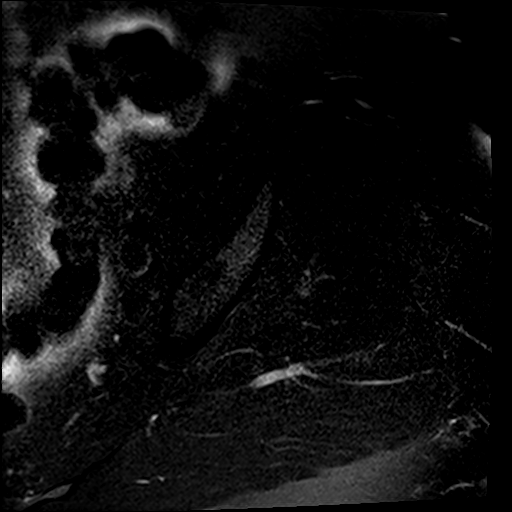

[Series 5: PD fat-sat · sagittal · left · 4.0mm · 0.70mm/px · 6 of 29 slices shown (1 of 3)]
[im 1/29]
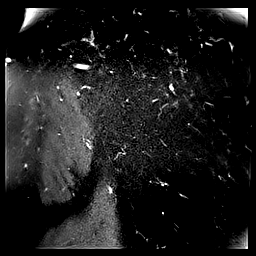
[im 6/29]
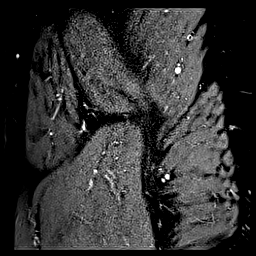
[im 12/29]
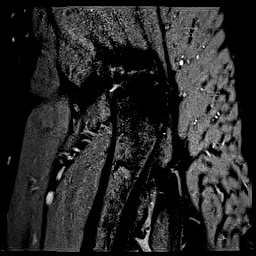
[im 17/29]
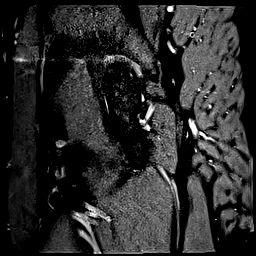
[im 23/29]
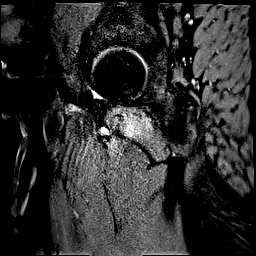
[im 29/29]
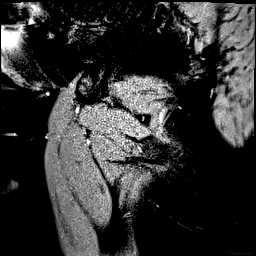

[Series 6: PD fat-sat · coronal · left · 4.5mm · 0.70mm/px · 6 of 32 slices shown (2 of 3)]
[im 1/32]
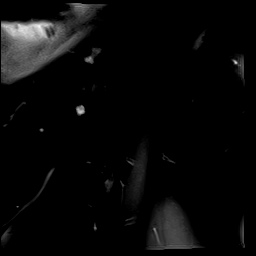
[im 7/32]
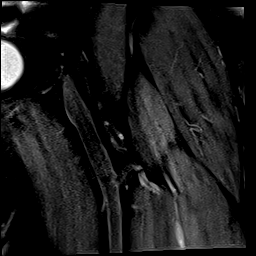
[im 13/32]
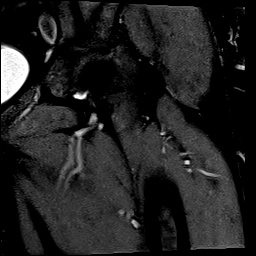
[im 19/32]
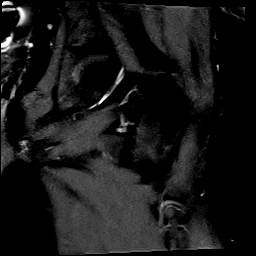
[im 25/32]
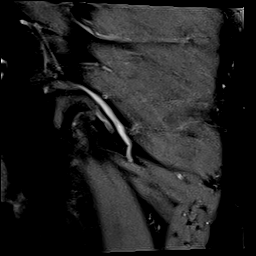
[im 32/32]
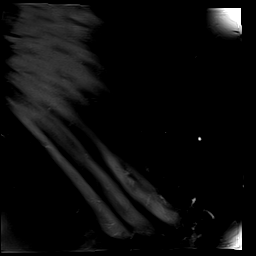

[Series 7: PD fat-sat · axial · left · 4.0mm · 0.69mm/px · z∈[-31,+114]mm · 6 of 30 slices shown (3 of 3)]
[im 1/30]
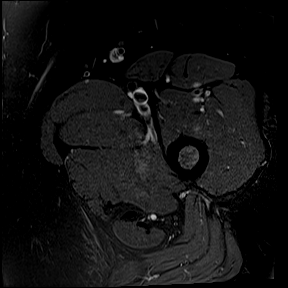
[im 6/30]
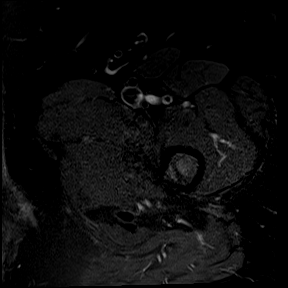
[im 12/30]
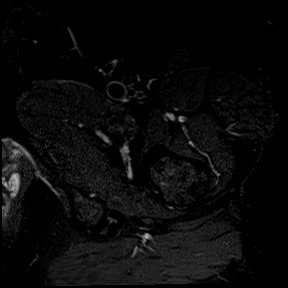
[im 18/30]
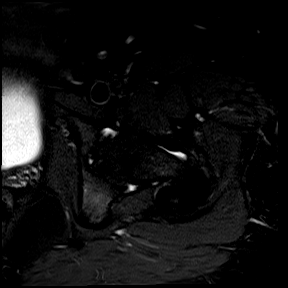
[im 24/30]
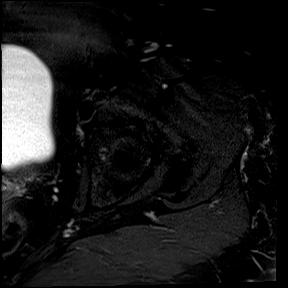
[im 30/30]
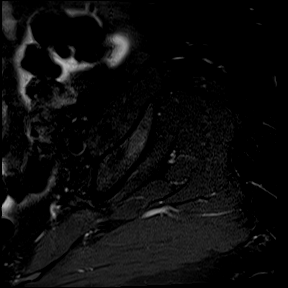

[34 of 40 positions shown; findings below may reference images not displayed]

FINDINGS: Bones: Normal. No evidence of fracture or focal bone lesion. No sign
of avascular necrosis.

Articular cartilage and labrum

Articular cartilage:  Normal

Labrum: Normal. Low sensitivity examination without intra-articular
contrast.

Joint or bursal effusion

Joint effusion:  Physiologic amount of joint fluid.

Bursae: No sign of bursitis.

Muscles and tendons

Muscles and tendons:  Normal

Other findings

Miscellaneous:   None
IMPRESSION: Normal examination.  No abnormality seen to explain left hip pain.

## 2020-06-24 IMAGING — CR DG LUMBAR SPINE 2-3V
3 series · 3 of 3 positions shown · non-contrast
Comparison: 05/25/2019

CLINICAL DATA: L2/L3 micro discectomy

EXAM:
LUMBAR SPINE - 2-3 VIEW

[lateral (1 of 3)]
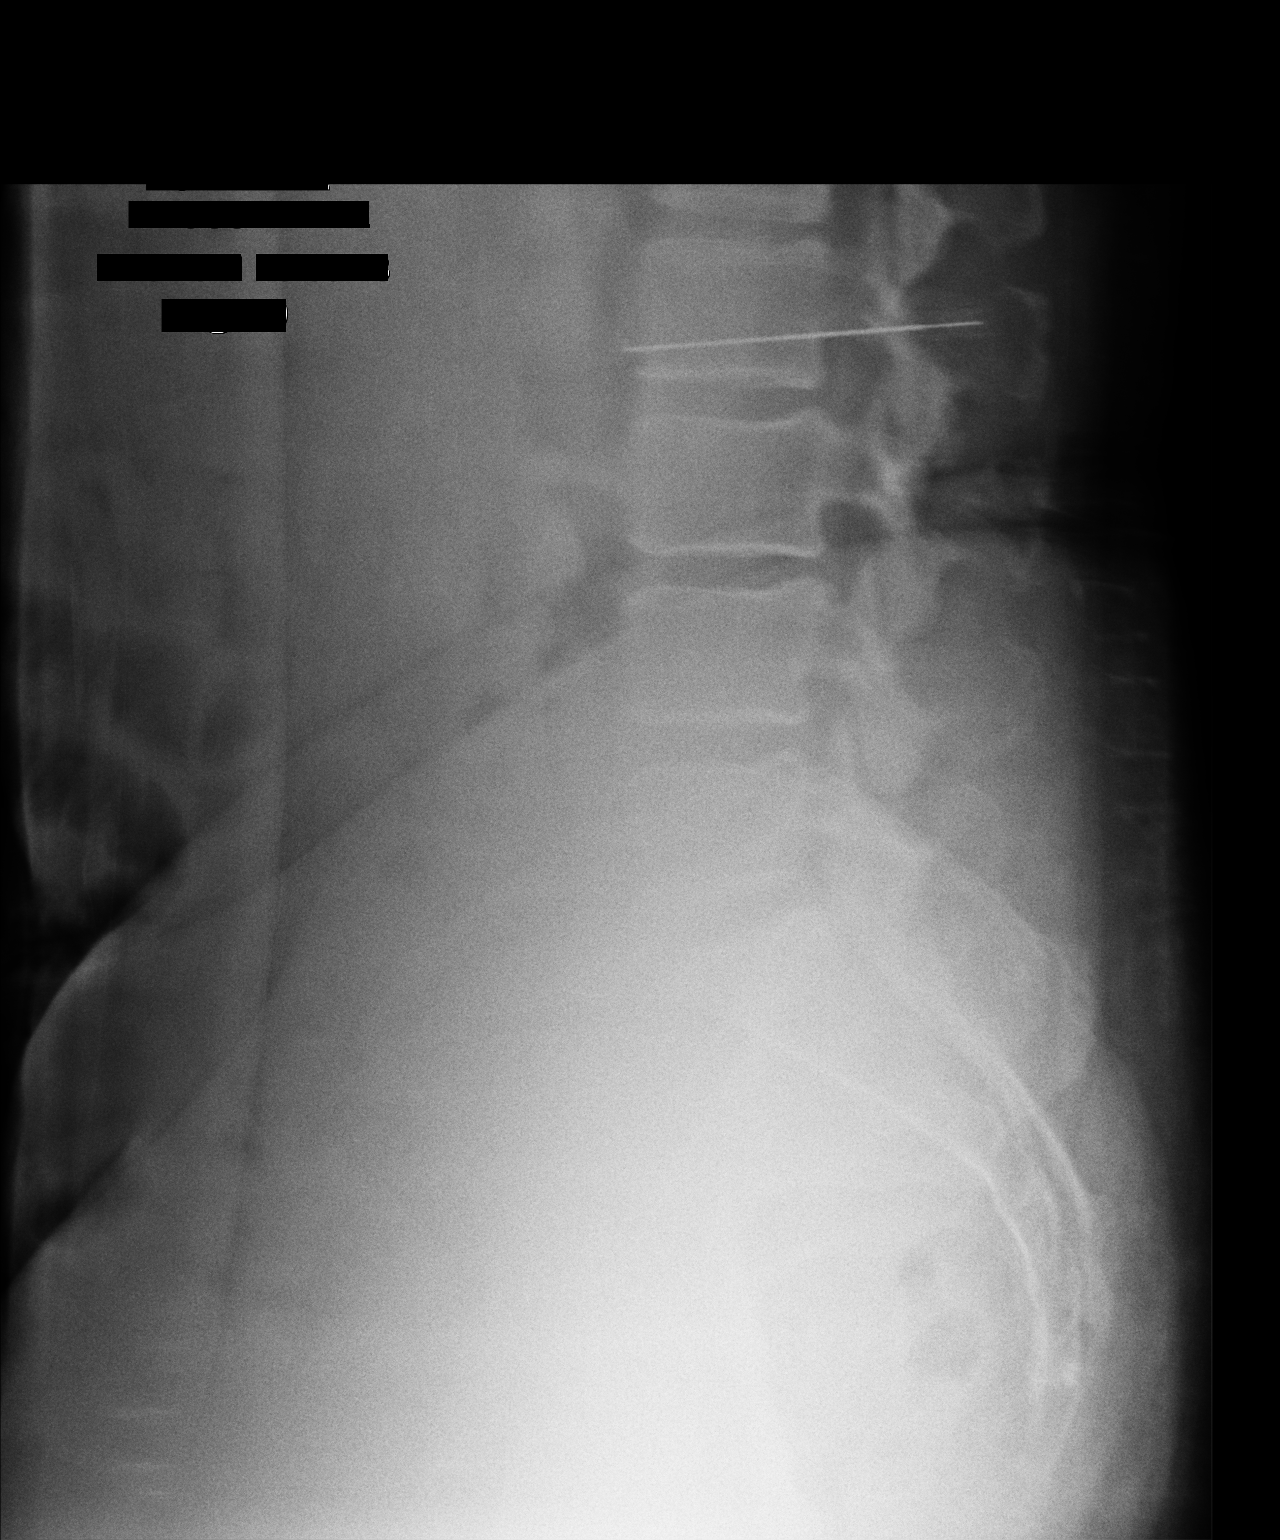

[lateral (2 of 3)]
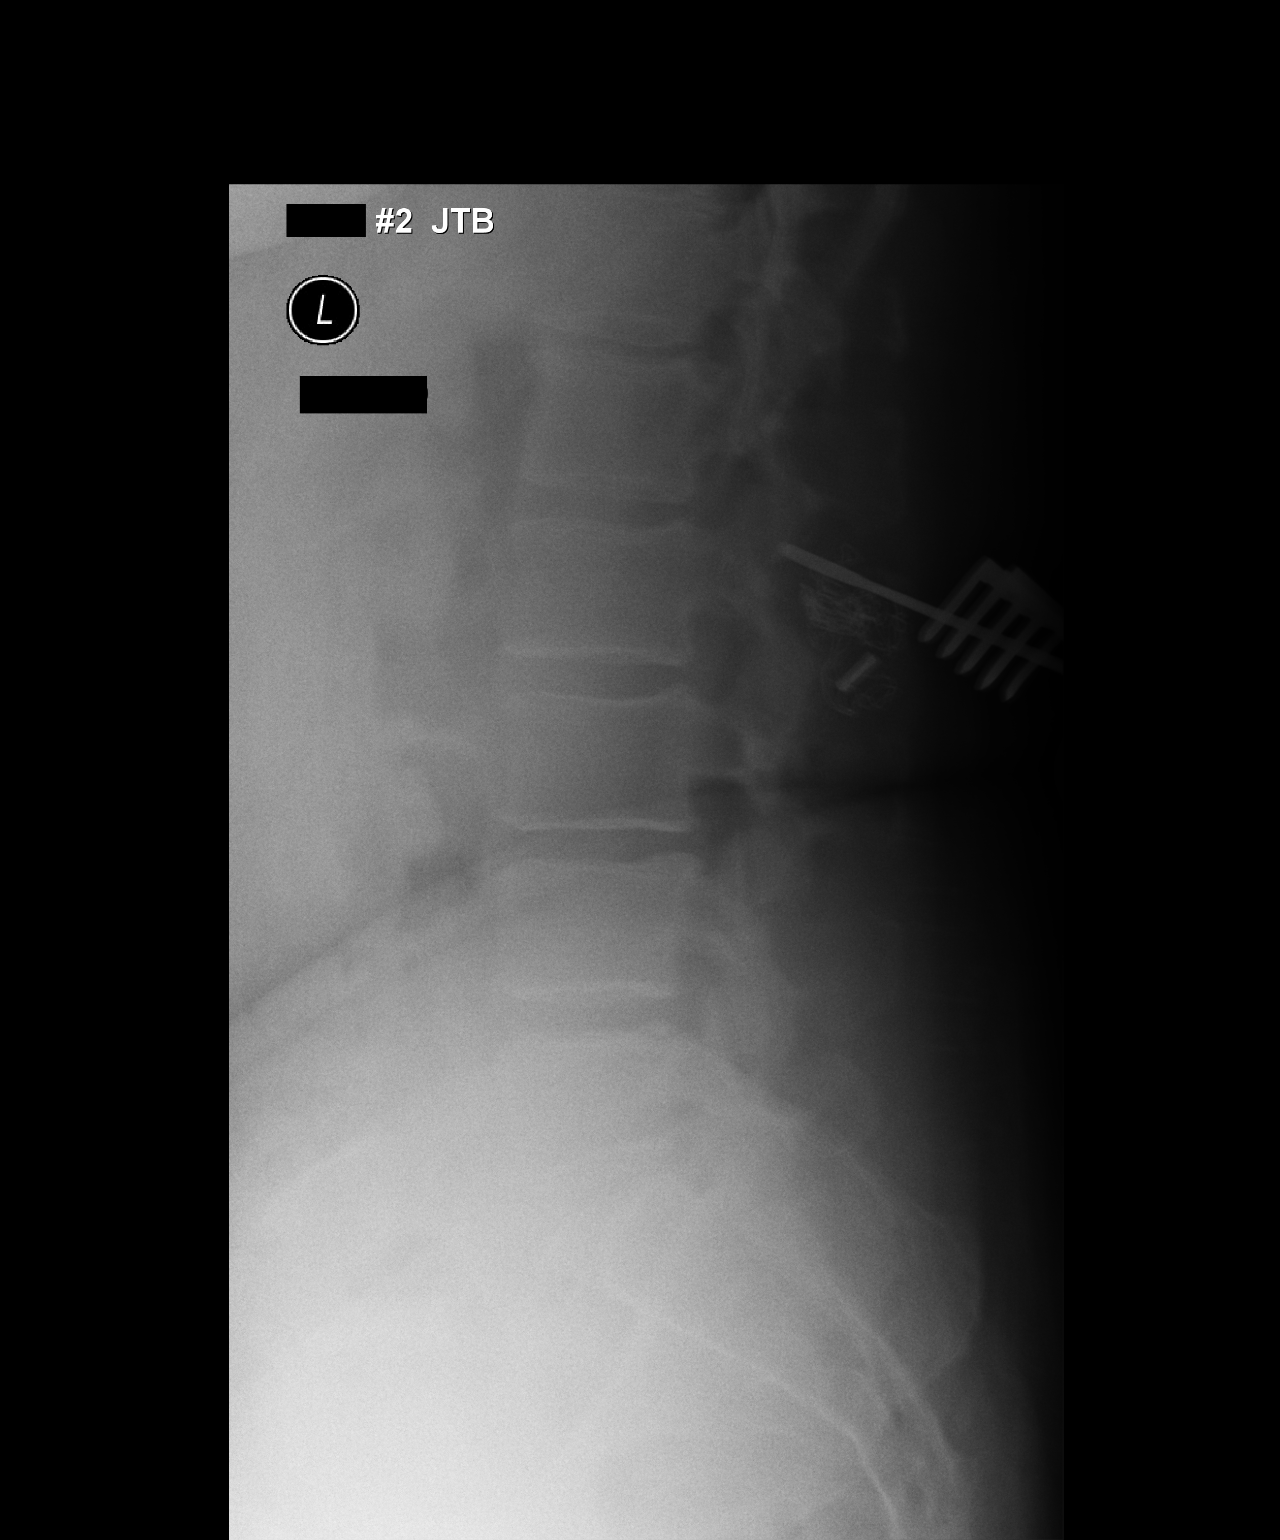

[lateral (3 of 3)]
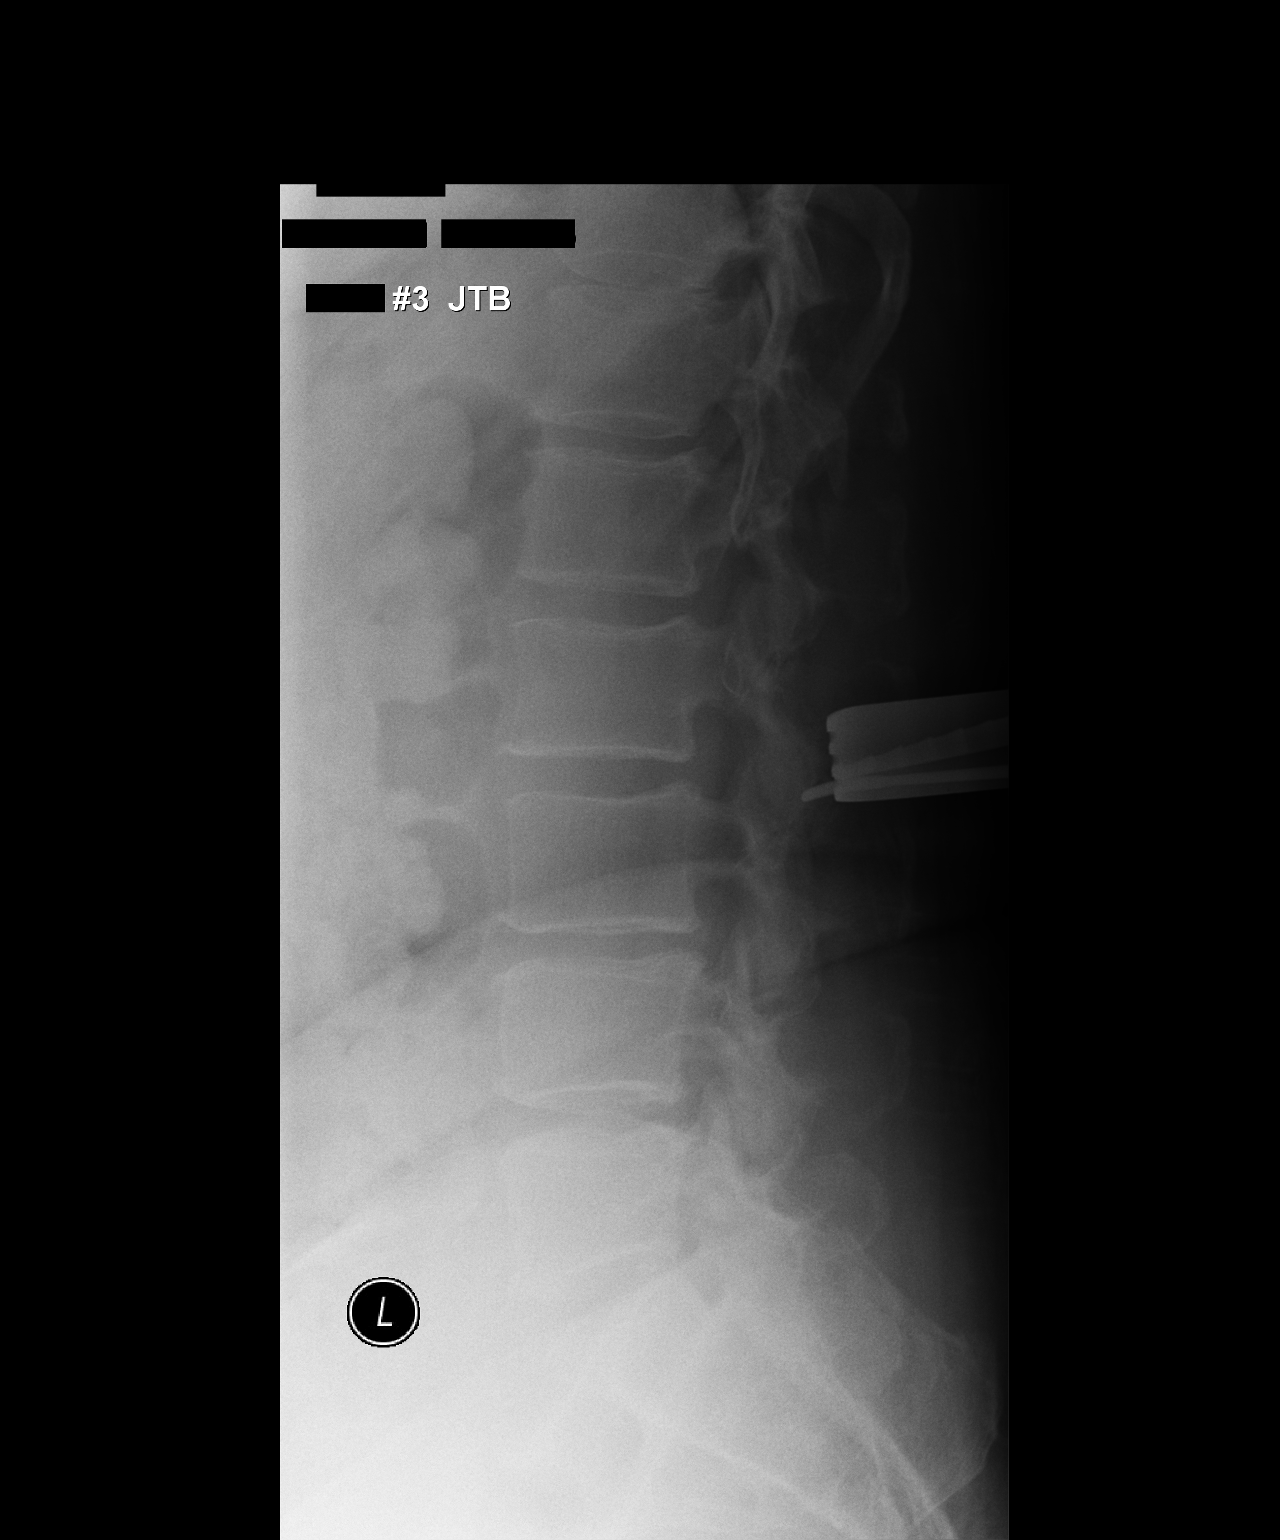

[3 of 3 positions shown; findings below may reference images not displayed]

FINDINGS: 3 lateral. Initial views of the lumbar spine image demonstrates are
obtained instrumentation overlying the L2 vertebral body. Second
image demonstrates surgical probe overlying the posterior elements
of L2. Final image demonstrates surgical instrumentation immediately
posterior to the L2/L3 disc space.
IMPRESSION: 1. Intraoperative evaluation of the lumbar spine as above.

## 2020-08-19 ENCOUNTER — Encounter: Payer: Self-pay | Admitting: Family Medicine

## 2022-09-04 LAB — HM DIABETES EYE EXAM

## 2022-11-29 ENCOUNTER — Ambulatory Visit: Payer: Self-pay | Admitting: *Deleted

## 2022-11-29 NOTE — Telephone Encounter (Signed)
Reason for Disposition  [1] Pain radiates into the thigh or further down the leg AND [2] both legs  Answer Assessment - Initial Assessment Questions 1. ONSET: "When did the pain begin?"      Past 2 days 2. LOCATION: "Where does it hurt?" (upper, mid or lower back)     Back-sciatica , leg weakness, groin pain, RLQ 3. SEVERITY: "How bad is the pain?"  (e.g., Scale 1-10; mild, moderate, or severe)   - MILD (1-3): Doesn't interfere with normal activities.    - MODERATE (4-7): Interferes with normal activities or awakens from sleep.    - SEVERE (8-10): Excruciating pain, unable to do any normal activities.      Mild/severe 4. PATTERN: "Is the pain constant?" (e.g., yes, no; constant, intermittent)      Comes and goes 5. RADIATION: "Does the pain shoot into your legs or somewhere else?"     In leg and groin 6. CAUSE:  "What do you think is causing the back pain?"      Unsure- weakness, edema 7. BACK OVERUSE:  "Any recent lifting of heavy objects, strenuous work or exercise?"     Patient has been more active 8. MEDICINES: "What have you taken so far for the pain?" (e.g., nothing, acetaminophen, NSAIDS)     diclofenac 9. NEUROLOGIC SYMPTOMS: "Do you have any weakness, numbness, or problems with bowel/bladder control?"     Leg weakness 10. OTHER SYMPTOMS: "Do you have any other symptoms?" (e.g., fever, abdomen pain, burning with urination, blood in urine)       Abdominal pain  Protocols used: Back Pain-A-AH

## 2022-11-29 NOTE — Telephone Encounter (Signed)
  Chief Complaint: back pain into legs, some abdominal pain Symptoms: see above Frequency: worse last 2 days Pertinent Negatives: Patient denies fever Disposition: [] ED /[] Urgent Care (no appt availability in office) / [x] Appointment(In office/virtual)/ []  [facility]/ [] Home Care/ [] Refused Recommended Disposition /[] [facility]/ []  Follow-up with PCP Additional Notes: Patient is concerned due to back pain with weakness in leg and knee pain associated with the back pain. Patient has been more active in the last week- but feels she should not feel this bad.  Offered appointment today- but patient prefers to see PCP tomorrow

## 2022-11-30 ENCOUNTER — Encounter: Payer: Self-pay | Admitting: Family Medicine

## 2022-11-30 ENCOUNTER — Ambulatory Visit: Payer: BC Managed Care – PPO | Admitting: Family Medicine

## 2022-11-30 VITALS — BP 142/82 | HR 87 | Temp 98.3°F | Resp 12 | Ht 67.0 in | Wt 278.5 lb

## 2022-11-30 DIAGNOSIS — E119 Type 2 diabetes mellitus without complications: Secondary | ICD-10-CM | POA: Diagnosis not present

## 2022-11-30 DIAGNOSIS — M25561 Pain in right knee: Secondary | ICD-10-CM

## 2022-11-30 DIAGNOSIS — E785 Hyperlipidemia, unspecified: Secondary | ICD-10-CM

## 2022-11-30 DIAGNOSIS — E1169 Type 2 diabetes mellitus with other specified complication: Secondary | ICD-10-CM | POA: Insufficient documentation

## 2022-11-30 DIAGNOSIS — R1031 Right lower quadrant pain: Secondary | ICD-10-CM | POA: Diagnosis not present

## 2022-11-30 DIAGNOSIS — M25562 Pain in left knee: Secondary | ICD-10-CM

## 2022-11-30 DIAGNOSIS — L4050 Arthropathic psoriasis, unspecified: Secondary | ICD-10-CM

## 2022-11-30 NOTE — Assessment & Plan Note (Signed)
Risk factor for heart disease, especially in the context of diabetes. -Order labs to check cholesterol levels.

## 2022-11-30 NOTE — Assessment & Plan Note (Signed)
Well-controlled on sulfasalazine. -Continue sulfasalazine. - f/b Rheum

## 2022-11-30 NOTE — Progress Notes (Signed)
Acute Office Visit  Subjective:     Patient ID: Ariel Doyle, female    DOB: February 02, 1961, 62 y.o.   MRN: [id number]  No chief complaint on file.     HPI Discussed the use of AI scribe software for clinical note transcription with the patient, who gave verbal consent to proceed.  History of Present Illness   The patient, with a history of psoriatic arthritis and meniscectomy in the left knee, presents with concerns about bilateral knee swelling and sharp pain in the right groin area. The symptoms started after a period of increased physical activity involving lifting and moving items. The patient reports that the pain was particularly sharp when bending over. The patient also noticed that the knees did not swell as much when she took a day to rest and elevate her feet. The patient's psoriatic arthritis has been well-managed with sulfasalazine, and she also takes diclofenac for pain management. The patient has been on metformin for weight loss, and despite not being explicitly diagnosed with Type 2 diabetes, she has had an A1C level of 8.3 in the past. The patient also underwent a recent surgery for extra skin removal from the eyes.       ROS      Objective:    BP (!) 142/82 (BP Location: Left Arm, Patient Position: Sitting, Cuff Size: Large)   Pulse 87   Temp 98.3 F (36.8 C) (Temporal)   Resp 12   Ht 5\' 7"  (1.702 m)   Wt 278 lb 8 oz (126.3 kg)   LMP 08/22/2014 Comment: irregular  SpO2 96%   BMI 43.62 kg/m    Physical Exam  Physical Exam   ABDOMEN: Tenderness in right lower quadrant, no tenderness in left lower quadrant or hip joint. + pain with hip internal and external rotation. EXTREMITIES: No edema. MUSCULOSKELETAL: Medial knee tenderness bilaterally, left knee pain on palpation at site of previous meniscectomy. NEUROLOGICAL: Intact sensation to light touch on feet.       No results found for any visits on 11/30/22.      Assessment & Plan:   Problem List  Items Addressed This Visit       Endocrine   Type 2 diabetes mellitus without complication, without long-term current use of insulin (HCC) - Primary    Previously uncontrolled with A1c of 8.3 in 2021, now well-controlled on metformin for weight loss. Last A1c in 2022 was 5.8. -Order labs to check current A1c and kidney function, UACR - roi for last eye exam - foot exam today. -Continue metformin.      Relevant Orders   Comprehensive metabolic panel   Lipid panel   Hemoglobin A1c   Urine Microalbumin w/creat. ratio   Hyperlipidemia associated with type 2 diabetes mellitus (HCC)    Risk factor for heart disease, especially in the context of diabetes. -Order labs to check cholesterol levels.      Relevant Orders   Comprehensive metabolic panel   Lipid panel     Musculoskeletal and Integument   Psoriatic arthritis (HCC)    Well-controlled on sulfasalazine. -Continue sulfasalazine. - f/b Rheum        Other   Morbid obesity (HCC)    Discussed importance of healthy weight management Discussed diet and exercise       Other Visit Diagnoses     Acute pain of both knees       Right groin pain       Relevant Orders   DG Hip  Unilat W OR W/O Pelvis 2-3 Views Right           Right Lower Quadrant Pain/Groin pain Acute onset of right lower quadrant pain with overexertion, improved with rest. Differential includes musculoskeletal strain, hip osteoarthritis, ovarian pathology, or appendicitis. No alarming symptoms such as fever or bowel changes. -Order hip x-ray to evaluate for osteoarthritis. -If pain worsens or new symptoms develop, consider advanced imaging (ultrasound or CT scan).  Bilateral Knee Pain Acute bilateral knee pain and swelling with overexertion, improved with rest. History of left knee meniscectomy. Likely osteoarthritis exacerbated by overuse. -Continue rest, elevation, ice, diclofenac, and Voltaren gel as needed. -Consider supportive footwear to improve  gait and reduce strain on knees.  General Health Maintenance -Obtain recent eye exam results from [facility] for diabetes management. -Plan for physical exam in 3 months to catch up on cancer screenings and other preventative care.        No orders of the defined types were placed in this encounter.   Return in about 3 months (around 03/02/2023) for CPE.  Shirlee Latch, MD

## 2022-11-30 NOTE — Assessment & Plan Note (Signed)
Discussed importance of healthy weight management Discussed diet and exercise  

## 2022-11-30 NOTE — Assessment & Plan Note (Signed)
Previously uncontrolled with A1c of 8.3 in 2021, now well-controlled on metformin for weight loss. Last A1c in 2022 was 5.8. -Order labs to check current A1c and kidney function, UACR - roi for last eye exam - foot exam today. -Continue metformin.

## 2022-12-01 LAB — COMPREHENSIVE METABOLIC PANEL
ALT: 30 IU/L (ref 0–32)
AST: 25 IU/L (ref 0–40)
Albumin: 4.3 g/dL (ref 3.8–4.9)
Alkaline Phosphatase: 91 IU/L (ref 44–121)
BUN/Creatinine Ratio: 19 (ref 12–28)
BUN: 15 mg/dL (ref 8–27)
Bilirubin Total: 0.3 mg/dL (ref 0.0–1.2)
CO2: 26 mmol/L (ref 20–29)
Calcium: 9.6 mg/dL (ref 8.7–10.3)
Chloride: 102 mmol/L (ref 96–106)
Creatinine, Ser: 0.79 mg/dL (ref 0.57–1.00)
Globulin, Total: 2.2 g/dL (ref 1.5–4.5)
Glucose: 101 mg/dL — ABNORMAL HIGH (ref 70–99)
Potassium: 4.4 mmol/L (ref 3.5–5.2)
Sodium: 141 mmol/L (ref 134–144)
Total Protein: 6.5 g/dL (ref 6.0–8.5)
eGFR: 86 mL/min/{1.73_m2} (ref >59)

## 2022-12-01 LAB — LIPID PANEL
Chol/HDL Ratio: 3.2 ratio (ref 0.0–4.4)
Cholesterol, Total: 198 mg/dL (ref 100–199)
HDL: 61 mg/dL (ref >39)
LDL Chol Calc (NIH): 112 mg/dL — ABNORMAL HIGH (ref 0–99)
Triglycerides: 144 mg/dL (ref 0–149)
VLDL Cholesterol Cal: 25 mg/dL (ref 5–40)

## 2022-12-01 LAB — MICROALBUMIN / CREATININE URINE RATIO
Creatinine, Urine: 211.7 mg/dL
Microalb/Creat Ratio: 18 mg/g{creat} (ref 0–29)
Microalbumin, Urine: 38.7 ug/mL

## 2022-12-01 LAB — HEMOGLOBIN A1C
Est. average glucose Bld gHb Est-mCnc: 134 mg/dL
Hgb A1c MFr Bld: 6.3 % — ABNORMAL HIGH (ref 4.8–5.6)

## 2022-12-05 ENCOUNTER — Other Ambulatory Visit: Payer: Self-pay

## 2022-12-05 MED ORDER — ROSUVASTATIN CALCIUM 5 MG PO TABS: 5.0000 mg | ORAL_TABLET | Freq: Every day | ORAL | 1 refills | Status: DC

## 2022-12-06 ENCOUNTER — Ambulatory Visit: Payer: Self-pay

## 2022-12-06 ENCOUNTER — Ambulatory Visit: Payer: BC Managed Care – PPO | Admitting: Family Medicine

## 2022-12-06 ENCOUNTER — Encounter: Payer: Self-pay | Admitting: Family Medicine

## 2022-12-06 VITALS — BP 156/86 | HR 88 | Temp 98.3°F | Resp 16

## 2022-12-06 DIAGNOSIS — M25562 Pain in left knee: Secondary | ICD-10-CM

## 2022-12-06 DIAGNOSIS — M25561 Pain in right knee: Secondary | ICD-10-CM | POA: Diagnosis not present

## 2022-12-06 NOTE — Telephone Encounter (Signed)
     Chief Complaint: Right knee injury Symptoms: Pain Frequency: Last night Pertinent Negatives: Patient denies swelling Disposition: [] ED /[] Urgent Care (no appt availability in office) / [x] Appointment(In office/virtual)/ []  [facility]/ [] Home Care/ [] Refused Recommended Disposition /[] [facility]/ []  Follow-up with PCP Additional Notes: Pt. Agrees with appointment today.  Reason for Disposition  [1] High-risk adult (e.g., age > 60 years, osteoporosis, chronic steroid use) AND [2] limping  Answer Assessment - Initial Assessment Questions 1. MECHANISM: "How did the injury happen?" (e.g., twisting injury, direct blow)      Popped 2. ONSET: "When did the injury happen?" (Minutes or hours ago)      Last night 3. LOCATION: "Where is the injury located?"      Right 4. APPEARANCE of INJURY: "What does the injury look like?"      No swelling 5. SEVERITY: "Can you put weight on that leg?" "Can you walk?"      No weight 6. SIZE: For cuts, bruises, or swelling, ask: "How large is it?" (e.g., inches or centimeters;  entire joint)      N/a 7. PAIN: "Is there pain?" If Yes, ask: "How bad is the pain?"  "What does it keep you from doing?" (e.g., Scale 1-10; or mild, moderate, severe)   -  NONE: (0): no pain   -  MILD (1-3): doesn't interfere with normal activities    -  MODERATE (4-7): interferes with normal activities (e.g., work or school) or awakens from sleep, limping    -  SEVERE (8-10): excruciating pain, unable to do any normal activities, unable to walk     Severe 8. TETANUS: For any breaks in the skin, ask: "When was the last tetanus booster?"     N/a 9. OTHER SYMPTOMS: "Do you have any other symptoms?"  (e.g., "pop" when knee injured, swelling, locking, buckling)      Nausea from the pain 10. PREGNANCY: "Is there any chance you are pregnant?" "When was your last menstrual period?"       No  Protocols used: Knee Injury-A-AH

## 2022-12-06 NOTE — Progress Notes (Signed)
Established patient visit   Patient: Ariel Doyle   DOB: 1960-09-06   62 y.o. Female  MRN: [id number] Visit Date: 12/06/2022  Today's healthcare provider: Ronnald Ramp, MD   Chief Complaint  Patient presents with   Knee Pain    Patient C/O left knee pain and swelling, she reports this is an on going problem. She reports she needs to have both knees replaced. She reports last evening her knee popped as she was getting in her SUV.    Subjective     HPI     Knee Pain    Additional comments: Patient C/O left knee pain and swelling, she reports this is an on going problem. She reports she needs to have both knees replaced. She reports last evening her knee popped as she was getting in her SUV.       Last edited by Myles Lipps, CMA on 12/06/2022  1:43 PM.       Discussed the use of AI scribe software for clinical note transcription with the patient, who gave verbal consent to proceed.  History of Present Illness   Ariel Doyle, a patient with a history of meniscectomy due to a torn meniscus, presents with severe pain in the right knee following an acute incident. The patient reports a history of knee discomfort, which was previously managed through weight loss of approximately 100 pounds under the guidance of a bariatric doctor. However, due to recent stressful life events, including the passing of both parents and mental health issues in her younger son, the patient has regained some weight, exacerbating the knee discomfort.  The patient's left knee, which previously underwent meniscectomy, has been the primary source of discomfort. However, the current acute pain is in the right knee, which the patient describes as her "good knee." The patient reports a popping sensation and severe pain while stepping out of her vehicle, which has since rendered her unable to walk without a walker. The pain is described as excruciating and is located all around the knee,  including the medial, lateral, and posterior aspects.  The patient has previously seen orthopedic specialists for her knee issues and has been advised that both knees may eventually require replacement. However, the patient has been trying to manage the discomfort through non-surgical means, including the use of diclofenac, Voltaren gel, ice, heat, and contrast baths. The patient strongly prefers to avoid pain medication due to dislike of the associated side effects.         Medications: Outpatient Medications Prior to Visit  Medication Sig   Blood Glucose Monitoring Suppl (ONE TOUCH ULTRA 2) w/Device KIT Use as directed to check blood glucose 1-4 times daily   cetirizine (ZYRTEC) 10 MG tablet Take 10 mg by mouth daily as needed for allergies.   diclofenac Sodium (VOLTAREN) 1 % GEL Apply 2-4 g topically 4 (four) times daily as needed (to affected knee or other painful sites).    Diclofenac Sodium CR 100 MG 24 hr tablet TAKE ONE TABLET BY MOUTH DAILY   glucose blood (ONETOUCH ULTRA) test strip Use as directed to check blood glucose 1-4 times daily   lansoprazole (PREVACID) 15 MG capsule Take 15 mg by mouth daily as needed (acid reflux).    metFORMIN (GLUCOPHAGE) 500 MG tablet Take 1 tablet (500 mg total) by mouth 2 (two) times daily with a meal.   OneTouch Delica Lancets 33G MISC Use as directed to check blood glucose 1-4 times daily   rosuvastatin (  CRESTOR) 5 MG tablet Take 1 tablet (5 mg total) by mouth daily.   sulfaSALAzine (AZULFIDINE) 500 MG tablet Take 2,000 mg by mouth every evening.    No facility-administered medications prior to visit.    Review of Systems      Objective    BP (!) 156/86 (BP Location: Left Arm, Patient Position: Sitting, Cuff Size: Large)   Pulse 88   Temp 98.3 F (36.8 C) (Temporal)   Resp 16   LMP 08/22/2014 Comment: irregular  SpO2 96%     Physical Exam  Knee: - Inspection: no gross deformity.+ swelling/effusion, no erythema or bruising. Skin  intact - Palpation: TTP of anterior and posterior right knee  - ROM: limited ROM with flexion and extension in knee  - Strength: 5/5 strength - Neuro/vasc: NV intact    No results found for any visits on 12/06/22.  Assessment & Plan     Problem List Items Addressed This Visit   None Visit Diagnoses     Lateral knee pain, left    -  Primary   Relevant Orders   Ambulatory referral to Orthopedics   Acute pain of both knees               Right Knee Pain Acute onset of severe pain following a pop while stepping out of a car. History of meniscectomy on the left knee. Pain is diffuse around the knee, with no visible bruising or redness. -Urgent orthopedic referral for further evaluation and management. -Consider pain management if pain becomes unbearable.  Left Knee Pain Chronic pain, history of meniscectomy. -Continue current management strategies including diclofenac and Voltaren gel.  Weight Management History of successful weight loss following bariatric consultation, however, recent stressors have led to weight regain. -Encourage re-engagement with weight management strategies when able.  Hip Pain Recent episode of hip pain that resolved with changes in footwear and stretching exercises. -Continue current management strategies.  Hypertension Elevated blood pressure noted during visit, possibly due to pain. -No changes to current management. Recheck blood pressure when pain is better controlled.         No follow-ups on file.         Ronnald Ramp, MD  [facility] [phone] (phone) [phone] (fax)  [facility]

## 2023-03-09 ENCOUNTER — Encounter: Payer: BC Managed Care – PPO | Admitting: Family Medicine

## 2023-03-20 LAB — CBC AND DIFFERENTIAL
HCT: 39 (ref 36–46)
Hemoglobin: 12.1 (ref 12.0–16.0)
Neutrophils Absolute: 53.1
Platelets: 276 10*3/uL (ref 150–400)
WBC: 6.3

## 2023-03-20 LAB — BASIC METABOLIC PANEL: Creatinine: 0.7 (ref 0.5–1.1)

## 2023-03-20 LAB — COMPREHENSIVE METABOLIC PANEL
Albumin: 4.3 (ref 3.5–5.0)
eGFR: 99

## 2023-03-20 LAB — CBC: RBC: 4.62 (ref 3.87–5.11)

## 2023-03-20 LAB — HEPATIC FUNCTION PANEL
ALT: 34 U/L (ref 7–35)
AST: 30 (ref 13–35)
Alkaline Phosphatase: 88 (ref 25–125)
Bilirubin, Total: 0.4

## 2023-03-25 ENCOUNTER — Ambulatory Visit
Admission: RE | Admit: 2023-03-25 | Discharge: 2023-03-25 | Disposition: A | Discharge status: Home / Self Care | Payer: BC Managed Care – PPO | Source: Ambulatory Visit

## 2023-03-25 VITALS — BP 166/83 | HR 101 | Temp 99.2°F | Resp 20 | Ht 66.0 in | Wt 275.0 lb

## 2023-03-25 DIAGNOSIS — J069 Acute upper respiratory infection, unspecified: Secondary | ICD-10-CM

## 2023-03-25 DIAGNOSIS — R030 Elevated blood-pressure reading, without diagnosis of hypertension: Secondary | ICD-10-CM

## 2023-03-25 MED ORDER — BENZONATATE 100 MG PO CAPS: 100.0000 mg | ORAL_CAPSULE | Freq: Three times a day (TID) | ORAL | 0 refills | Status: DC

## 2023-03-25 MED ORDER — PROMETHAZINE-DM 6.25-15 MG/5ML PO SYRP: 5.0000 mL | ORAL_SOLUTION | Freq: Every evening | ORAL | 0 refills | Status: DC | PRN

## 2023-03-25 MED ORDER — AZITHROMYCIN 250 MG PO TABS: 250.0000 mg | ORAL_TABLET | Freq: Every day | ORAL | 0 refills | Status: DC

## 2023-03-25 MED ORDER — PREDNISONE 10 MG (21) PO TBPK: ORAL_TABLET | Freq: Every day | ORAL | 0 refills | Status: DC

## 2023-03-25 NOTE — ED Provider Notes (Addendum)
Ariel Doyle    CSN: [id number] Arrival date & time: 03/25/23  1240      History   Chief Complaint Chief Complaint  Patient presents with   Cough    HPI Ariel Doyle is a 62 y.o. female.   Patient presents for evaluation of a productive cough, nasal congestion and bilateral itchy ears present for 3 days.  Known sick contact within household.  Denies shortness of breath wheezing abdominal symptoms.  Denies respiratory history, non-smoker.  Tolerating food and liquids.  Has attempted use of Mucinex.    Past Medical History:  Diagnosis Date   Arthritis     Patient Active Problem List   Diagnosis Date Noted   Type 2 diabetes mellitus without complication, without long-term current use of insulin (HCC) 11/30/2022   Hyperlipidemia associated with type 2 diabetes mellitus (HCC) 11/30/2022   Morbid obesity (HCC) 08/19/2019   Lumbar herniated disc 05/27/2019   Psoriatic arthritis (HCC) 05/21/2019   Chronic pain of left knee 03/30/2015   DDD (degenerative disc disease), lumbosacral 08/13/2014   Drug-induced obesity 08/13/2014   Allergic rhinitis 06/26/2009    Past Surgical History:  Procedure Laterality Date   BLEPHAROPLASTY Bilateral    Dr Gertie Baron   FOOT SURGERY Right    Buninectomy and toes straightened   FOOT SURGERY Right    2nd  repair of toes that were previously operated on   FOOT SURGERY Right     a pin removed   KNEE ARTHROSCOPY Left    meniscus tear   LUMBAR LAMINECTOMY/DECOMPRESSION MICRODISCECTOMY Left 05/28/2019   Procedure: MICRODISCECTOMY LEFT LUMBAR 2- LUMBAR 3;  Surgeon: Tressie Stalker, MD;  Location: [facility] OR;  Service: Neurosurgery;  Laterality: Left;  MICRODISCECTOMY LEFT LUMBAR 2- LUMBAR 3   TUBAL LIGATION      OB History   No obstetric history on file.      Home Medications    Prior to Admission medications   Medication Sig Start Date End Date Taking? Authorizing Provider  azithromycin (ZITHROMAX) 250 MG tablet Take 1  tablet (250 mg total) by mouth daily. Take first 2 tablets together, then 1 every day until finished. 03/25/23  Yes Thurmon Mizell R, NP  benzonatate (TESSALON) 100 MG capsule Take 1 capsule (100 mg total) by mouth every 8 (eight) hours. 03/25/23  Yes Niza Soderholm, Elita Boone, NP  Levocetirizine Dihydrochloride (XYZAL ALLERGY 24HR PO) Take by mouth.   Yes [provider]  predniSONE (STERAPRED UNI-PAK 21 TAB) 10 MG (21) TBPK tablet Take by mouth daily. Take 6 tabs by mouth daily  for 1 days, then 5 tabs for 1 days, then 4 tabs for 1 days, then 3 tabs for 1 days, 2 tabs for 1 days, then 1 tab by mouth daily for 1 days 03/25/23  Yes Cristan Hout R, NP  promethazine-dextromethorphan (PROMETHAZINE-DM) 6.25-15 MG/5ML syrup Take 5 mLs by mouth at bedtime as needed for cough. 03/25/23  Yes Kendelle Schweers R, NP  Blood Glucose Monitoring Suppl (ONE TOUCH ULTRA 2) w/Device KIT Use as directed to check blood glucose 1-4 times daily 08/20/19   Erasmo Downer, MD  cetirizine (ZYRTEC) 10 MG tablet Take 10 mg by mouth daily as needed for allergies.    [provider]  diclofenac Sodium (VOLTAREN) 1 % GEL Apply 2-4 g topically 4 (four) times daily as needed (to affected knee or other painful sites).     [provider]  Diclofenac Sodium CR 100 MG 24 hr tablet TAKE ONE  TABLET BY MOUTH DAILY 07/14/19   [provider]  glucose blood (ONETOUCH ULTRA) test strip Use as directed to check blood glucose 1-4 times daily 08/20/19   Erasmo Downer, MD  lansoprazole (PREVACID) 15 MG capsule Take 15 mg by mouth daily as needed (acid reflux).     [provider]  metFORMIN (GLUCOPHAGE) 500 MG tablet Take 1 tablet (500 mg total) by mouth 2 (two) times daily with a meal. 08/20/19   Bacigalupo, Marzella Schlein, MD  OneTouch Delica Lancets 33G MISC Use as directed to check blood glucose 1-4 times daily 08/20/19   Erasmo Downer, MD  rosuvastatin (CRESTOR) 5 MG tablet Take 1 tablet (5 mg total)  by mouth daily. 12/05/22   Erasmo Downer, MD  sulfaSALAzine (AZULFIDINE) 500 MG tablet Take 2,000 mg by mouth every evening.  03/30/17   [provider]    Family History Family History  Problem Relation Age of Onset   Breast cancer Mother    Diabetes Father    Heart attack Father     Social History Social History   Tobacco Use   Smoking status: Never   Smokeless tobacco: Never  Vaping Use   Vaping status: Never Used  Substance Use Topics   Alcohol use: No   Drug use: No     Allergies   Patient has no known allergies.   Review of Systems Review of Systems  Respiratory:  Positive for cough.      Physical Exam Triage Vital Signs ED Triage Vitals  Encounter Vitals Group     BP 03/25/23 1312 (!) 183/84     Systolic BP Percentile --      Diastolic BP Percentile --      Pulse Rate 03/25/23 1312 (!) 101     Resp 03/25/23 1312 20     Temp 03/25/23 1312 99.2 F (37.3 C)     Temp Source 03/25/23 1312 Oral     SpO2 03/25/23 1312 93 %     Weight 03/25/23 1309 275 lb (124.7 kg)     Height 03/25/23 1309 5\' 6"  (1.676 m)     Head Circumference --      Peak Flow --      Pain Score 03/25/23 1309 0     Pain Loc --      Pain Education --      Exclude from Growth Chart --    No data found.  Updated Vital Signs BP (!) 166/83 (BP Location: Left Arm)   Pulse (!) 101   Temp 99.2 F (37.3 C) (Oral)   Resp 20   Ht 5\' 6"  (1.676 m)   Wt 275 lb (124.7 kg)   LMP 08/22/2014 Comment: irregular  SpO2 93%   BMI 44.39 kg/m   Visual Acuity Right Eye Distance:   Left Eye Distance:   Bilateral Distance:    Right Eye Near:   Left Eye Near:    Bilateral Near:     Physical Exam Constitutional:      Appearance: Normal appearance.  HENT:     Head: Normocephalic.     Right Ear: Tympanic membrane, ear canal and external ear normal.     Left Ear: Tympanic membrane, ear canal and external ear normal.     Nose: Congestion present. No rhinorrhea.      Mouth/Throat:     Mouth: Mucous membranes are moist.     Pharynx: Oropharynx is clear. No oropharyngeal exudate or posterior oropharyngeal erythema.  Eyes:  Extraocular Movements: Extraocular movements intact.  Cardiovascular:     Rate and Rhythm: Normal rate and regular rhythm.     Pulses: Normal pulses.     Heart sounds: Normal heart sounds.  Pulmonary:     Effort: Pulmonary effort is normal.     Comments: Wheezing heard only when coughing Musculoskeletal:     Cervical back: Normal range of motion.  Lymphadenopathy:     Cervical: Cervical adenopathy present.  Neurological:     Mental Status: She is alert and oriented to person, place, and time. Mental status is at baseline.      UC Treatments / Results  Labs (all labs ordered are listed, but only abnormal results are displayed) Labs Reviewed - No data to display  EKG   Radiology No results found.  Procedures Procedures (including critical care time)  Medications Ordered in UC Medications - No data to display  Initial Impression / Assessment and Plan / UC Course  I have reviewed the triage vital signs and the nursing notes.  Pertinent labs & imaging results that were available during my care of the patient were reviewed by me and considered in my medical decision making (see chart for details).  Viral URI with cough, elevated blood pressure reading without diagnosis of hypertension  Blood pressure 166/83 in triage, discussed with patient to monitor at home, possibly due to coughing, denies prior history,, endorses blood pressure is not typically elevated at baseline and will become elevated has not taken manually, in no signs of distress nontoxic-appearing, stable for outpatient management, lungs are clear to auscultation wheezing is only heard when coughing, deferring x-ray imaging at this time, discussed etiology is viral as sick contact was in household prior, prescribed prednisone, Tessalon and Promethazine DM,  offered watchful wait antibiotic however patient endorses that she would like antibiotic to be prescribed for pickup today as her husband received antibiotics in this clinic immediately upon evaluation, discussed that there is no bacteria present related to her illness currently and that she should allow for symptoms to progress to see if they worsen or persist, may use additional over-the-counter medications as needed with follow-up as needed    Final Clinical Impressions(s) / UC Diagnoses   Final diagnoses:  Viral URI with cough     Discharge Instructions      Your symptoms today are most likely being caused by a virus and should steadily improve in time it can take up to 7 to 10 days before you truly start to see a turnaround however things will get better, please wait to Friday before use of antibiotic as this is not a bacterial infection  Begin prednisone every morning as directed to open and relax her airway should settle wheezing that is hard on exam  You may use Tessalon pill every 8 hours as needed to help calm coughing May use cough syrup at bedtime     You can take Tylenol and/or Ibuprofen as needed for fever reduction and pain relief.   For cough: honey 1/2 to 1 teaspoon (you can dilute the honey in water or another fluid).  You can also use guaifenesin and dextromethorphan for cough. You can use a humidifier for chest congestion and cough.  If you don't have a humidifier, you can sit in the bathroom with the hot shower running.      For sore throat: try warm salt water gargles, cepacol lozenges, throat spray, warm tea or water with lemon/honey, popsicles or ice, or OTC cold relief  medicine for throat discomfort.   For congestion: take a daily anti-histamine like Zyrtec, Claritin, and a oral decongestant, such as pseudoephedrine.  You can also use Flonase 1-2 sprays in each nostril daily.   It is important to stay hydrated: drink plenty of fluids (water,  gatorade/powerade/pedialyte, juices, or teas) to keep your throat moisturized and help further relieve irritation/discomfort.    ED Prescriptions     Medication Sig Dispense Auth. Provider   azithromycin (ZITHROMAX) 250 MG tablet Take 1 tablet (250 mg total) by mouth daily. Take first 2 tablets together, then 1 every day until finished. 6 tablet Trinady Milewski R, NP   predniSONE (STERAPRED UNI-PAK 21 TAB) 10 MG (21) TBPK tablet Take by mouth daily. Take 6 tabs by mouth daily  for 1 days, then 5 tabs for 1 days, then 4 tabs for 1 days, then 3 tabs for 1 days, 2 tabs for 1 days, then 1 tab by mouth daily for 1 days 21 tablet Gem Conkle R, NP   benzonatate (TESSALON) 100 MG capsule Take 1 capsule (100 mg total) by mouth every 8 (eight) hours. 21 capsule Demonta Wombles R, NP   promethazine-dextromethorphan (PROMETHAZINE-DM) 6.25-15 MG/5ML syrup Take 5 mLs by mouth at bedtime as needed for cough. 118 mL Ayoub Arey, Elita Boone, NP      PDMP not reviewed this encounter.   Valinda Hoar, NP 03/25/23 1356    Valinda Hoar, [place] 03/25/23 1408

## 2023-03-25 NOTE — Discharge Instructions (Signed)
Your symptoms today are most likely being caused by a virus and should steadily improve in time it can take up to 7 to 10 days before you truly start to see a turnaround however things will get better, please wait to Friday before use of antibiotic as this is not a bacterial infection  Begin prednisone every morning as directed to open and relax her airway should settle wheezing that is hard on exam  You may use Tessalon pill every 8 hours as needed to help calm coughing May use cough syrup at bedtime     You can take Tylenol and/or Ibuprofen as needed for fever reduction and pain relief.   For cough: honey 1/2 to 1 teaspoon (you can dilute the honey in water or another fluid).  You can also use guaifenesin and dextromethorphan for cough. You can use a humidifier for chest congestion and cough.  If you don't have a humidifier, you can sit in the bathroom with the hot shower running.      For sore throat: try warm salt water gargles, cepacol lozenges, throat spray, warm tea or water with lemon/honey, popsicles or ice, or OTC cold relief medicine for throat discomfort.   For congestion: take a daily anti-histamine like Zyrtec, Claritin, and a oral decongestant, such as pseudoephedrine.  You can also use Flonase 1-2 sprays in each nostril daily.   It is important to stay hydrated: drink plenty of fluids (water, gatorade/powerade/pedialyte, juices, or teas) to keep your throat moisturized and help further relieve irritation/discomfort.

## 2023-03-25 NOTE — ED Triage Notes (Signed)
Patient states cough/congestion since Thursday.  Taking Mucinex OTC with some relief

## 2023-03-27 ENCOUNTER — Ambulatory Visit (INDEPENDENT_AMBULATORY_CARE_PROVIDER_SITE_OTHER): Payer: BC Managed Care – PPO | Admitting: Family Medicine

## 2023-03-27 VITALS — BP 138/84 | HR 96 | Resp 16 | Ht 66.5 in | Wt 275.0 lb

## 2023-03-27 DIAGNOSIS — Z7984 Long term (current) use of oral hypoglycemic drugs: Secondary | ICD-10-CM | POA: Diagnosis not present

## 2023-03-27 DIAGNOSIS — Z23 Encounter for immunization: Secondary | ICD-10-CM | POA: Diagnosis not present

## 2023-03-27 DIAGNOSIS — Z1211 Encounter for screening for malignant neoplasm of colon: Secondary | ICD-10-CM

## 2023-03-27 DIAGNOSIS — E785 Hyperlipidemia, unspecified: Secondary | ICD-10-CM

## 2023-03-27 DIAGNOSIS — Z803 Family history of malignant neoplasm of breast: Secondary | ICD-10-CM

## 2023-03-27 DIAGNOSIS — E1169 Type 2 diabetes mellitus with other specified complication: Secondary | ICD-10-CM | POA: Diagnosis not present

## 2023-03-27 DIAGNOSIS — Z0000 Encounter for general adult medical examination without abnormal findings: Secondary | ICD-10-CM

## 2023-03-27 DIAGNOSIS — Z0001 Encounter for general adult medical examination with abnormal findings: Secondary | ICD-10-CM | POA: Diagnosis not present

## 2023-03-27 DIAGNOSIS — E119 Type 2 diabetes mellitus without complications: Secondary | ICD-10-CM

## 2023-03-27 DIAGNOSIS — Z1231 Encounter for screening mammogram for malignant neoplasm of breast: Secondary | ICD-10-CM

## 2023-03-27 DIAGNOSIS — L4050 Arthropathic psoriasis, unspecified: Secondary | ICD-10-CM

## 2023-03-27 LAB — POCT GLYCOSYLATED HEMOGLOBIN (HGB A1C)
Est. average glucose Bld gHb Est-mCnc: 137
Hemoglobin A1C: 6.4 % — AB (ref 4.0–5.6)

## 2023-03-27 MED ORDER — SULFASALAZINE 500 MG PO TABS: 2000.0000 mg | ORAL_TABLET | Freq: Every evening | ORAL | 1 refills | Status: DC

## 2023-03-27 MED ORDER — ROSUVASTATIN CALCIUM 5 MG PO TABS: 5.0000 mg | ORAL_TABLET | Freq: Every day | ORAL | 1 refills | Status: DC

## 2023-03-27 MED ORDER — DICLOFENAC SODIUM 50 MG PO TBEC: 100.0000 mg | DELAYED_RELEASE_TABLET | Freq: Every day | ORAL | 1 refills | Status: DC

## 2023-03-27 NOTE — Patient Instructions (Signed)
Call [facility] to schedule a mammogram [phone]  

## 2023-03-27 NOTE — Progress Notes (Unsigned)
Complete physical exam  Patient: Ariel Doyle   DOB: 1961-03-21   62 y.o. Female  MRN: [id number]  Subjective:    Chief Complaint  Patient presents with   Annual Exam    Ariel Doyle is a 62 y.o. female who presents today for a complete physical exam. She reports consuming a general diet.  She generally feels well. She reports sleeping well. She does have additional problems to discuss today.    Discussed the use of AI scribe software for clinical note transcription with the patient, who gave verbal consent to proceed.  History of Present Illness   A 62 year old patient with a history of type 2 diabetes, hyperlipidemia, psoriatic arthritis, and morbid obesity presents for her annual physical. She reports taking sulfasalazine for her psoriatic arthritis, metformin for diabetes, and Crestor for cholesterol. She expresses concern about a diagnosis of drug-induced obesity in her chart, which she believes is inaccurate as she has not taken any drugs known to induce obesity.  The patient also discusses a recent knee injury, for which she has been attending physical therapy. She notes that her knee has been feeling better since starting prednisone for a recent bout of bronchitis.  The patient expresses dissatisfaction with her current rheumatologist, who she feels is requiring too frequent visits despite her stable condition. She is considering switching her rheumatology care to her primary care physician.  The patient also has a family history of breast cancer, with both her mother and grandmother having had the disease. She has not had genetic testing but agrees to be referred for this.       Most recent fall risk assessment:    03/27/2023    3:13 PM  Fall Risk   Falls in the past year? 0  Number falls in past yr: 0  Injury with Fall? 0  Risk for fall due to : No Fall Risks     Most recent depression screenings:    03/27/2023    3:14 PM 11/30/2022    9:16 AM  PHQ 2/9  Scores  PHQ - 2 Score 0 0        Patient Care Team: Erasmo Downer, MD as PCP - General (Family Medicine)   Outpatient Medications Prior to Visit  Medication Sig   benzonatate (TESSALON) 100 MG capsule Take 1 capsule (100 mg total) by mouth every 8 (eight) hours.   Blood Glucose Monitoring Suppl (ONE TOUCH ULTRA 2) w/Device KIT Use as directed to check blood glucose 1-4 times daily   cetirizine (ZYRTEC) 10 MG tablet Take 10 mg by mouth daily as needed for allergies.   diclofenac Sodium (VOLTAREN) 1 % GEL Apply 2-4 g topically 4 (four) times daily as needed (to affected knee or other painful sites).    glucose blood (ONETOUCH ULTRA) test strip Use as directed to check blood glucose 1-4 times daily   lansoprazole (PREVACID) 15 MG capsule Take 15 mg by mouth daily as needed (acid reflux).    Levocetirizine Dihydrochloride (XYZAL ALLERGY 24HR PO) Take by mouth.   metFORMIN (GLUCOPHAGE) 500 MG tablet Take 1 tablet (500 mg total) by mouth 2 (two) times daily with a meal.   OneTouch Delica Lancets 33G MISC Use as directed to check blood glucose 1-4 times daily   predniSONE (STERAPRED UNI-PAK 21 TAB) 10 MG (21) TBPK tablet Take by mouth daily. Take 6 tabs by mouth daily  for 1 days, then 5 tabs for 1 days, then 4 tabs for 1 days,  then 3 tabs for 1 days, 2 tabs for 1 days, then 1 tab by mouth daily for 1 days   [DISCONTINUED] Diclofenac Sodium CR 100 MG 24 hr tablet TAKE ONE TABLET BY MOUTH DAILY   [DISCONTINUED] rosuvastatin (CRESTOR) 5 MG tablet Take 1 tablet (5 mg total) by mouth daily.   [DISCONTINUED] sulfaSALAzine (AZULFIDINE) 500 MG tablet Take 2,000 mg by mouth every evening.    [DISCONTINUED] azithromycin (ZITHROMAX) 250 MG tablet Take 1 tablet (250 mg total) by mouth daily. Take first 2 tablets together, then 1 every day until finished.   [DISCONTINUED] promethazine-dextromethorphan (PROMETHAZINE-DM) 6.25-15 MG/5ML syrup Take 5 mLs by mouth at bedtime as needed for cough. (Patient  not taking: Reported on 03/27/2023)   No facility-administered medications prior to visit.    ROS      Objective:     BP 138/84 (BP Location: Left Arm, Patient Position: Sitting, Cuff Size: Large)   Pulse 96   Resp 16   Ht 5' 6.5" (1.689 m)   Wt 275 lb (124.7 kg) Comment: per patient  LMP 08/22/2014 Comment: irregular  BMI 43.72 kg/m    Physical Exam Vitals reviewed.  Constitutional:      General: She is not in acute distress.    Appearance: Normal appearance. She is well-developed. She is not diaphoretic.  HENT:     Head: Normocephalic and atraumatic.     Right Ear: Tympanic membrane, ear canal and external ear normal.     Left Ear: Tympanic membrane, ear canal and external ear normal.     Nose: Nose normal.     Mouth/Throat:     Mouth: Mucous membranes are moist.     Pharynx: Oropharynx is clear. No oropharyngeal exudate.  Eyes:     General: No scleral icterus.    Conjunctiva/sclera: Conjunctivae normal.     Pupils: Pupils are equal, round, and reactive to light.  Neck:     Thyroid: No thyromegaly.  Cardiovascular:     Rate and Rhythm: Normal rate and regular rhythm.     Heart sounds: Normal heart sounds. No murmur heard. Pulmonary:     Effort: Pulmonary effort is normal. No respiratory distress.     Breath sounds: Normal breath sounds. No wheezing or rales.  Abdominal:     General: There is no distension.     Palpations: Abdomen is soft.     Tenderness: There is no abdominal tenderness.  Musculoskeletal:        General: No deformity.     Cervical back: Neck supple.     Right lower leg: No edema.     Left lower leg: No edema.  Lymphadenopathy:     Cervical: No cervical adenopathy.  Skin:    General: Skin is warm and dry.     Findings: No rash.  Neurological:     Mental Status: She is alert and oriented to person, place, and time. Mental status is at baseline.     Gait: Gait normal.  Psychiatric:        Mood and Affect: Mood normal.        Behavior:  Behavior normal.        Thought Content: Thought content normal.      Results for orders placed or performed in visit on 03/27/23  POCT HgB A1C  Result Value Ref Range   Hemoglobin A1C 6.4 (A) 4.0 - 5.6 %   Est. average glucose Bld gHb Est-mCnc 137        Assessment &  Plan:    Routine Health Maintenance and Physical Exam  Immunization History  Administered Date(s) Administered   Influenza, Seasonal, Injecte, Preservative Fre 02/23/2016, 02/07/2017   Influenza,inj,Quad PF,6+ Mos 01/20/2019   Influenza,inj,quad, With Preservative 01/19/2020   Influenza-Unspecified 01/16/2018, 01/10/2023   PFIZER(Purple Top)SARS-COV-2 Vaccination 04/21/2019, 05/12/2019   Pfizer(Comirnaty)Fall Seasonal Vaccine 12 years and older 12/29/2022   Pneumococcal Conjugate-13 08/19/2019   Pneumococcal Polysaccharide-23 10/23/2019   Tdap 02/22/2010, 03/27/2023   Zoster Recombinant(Shingrix) 08/19/2019, 10/23/2019    Health Maintenance  Topic Date Due   Fecal DNA (Cologuard)  Never done   MAMMOGRAM  Never done   Cervical Cancer Screening (Pap smear)  08/18/2020   COVID-19 Vaccine (4 - 2023-24 season) 02/23/2023   OPHTHALMOLOGY EXAM  09/04/2023   HEMOGLOBIN A1C  09/25/2023   Diabetic kidney evaluation - eGFR measurement  11/30/2023   Diabetic kidney evaluation - Urine ACR  11/30/2023   FOOT EXAM  11/30/2023   Pneumococcal Vaccine 88-17 Years old (3 of 3 - PPSV23 or PCV20) 10/07/2027   DTaP/Tdap/Td (3 - Td or Tdap) 03/26/2033   INFLUENZA VACCINE  Completed   Hepatitis C Screening  Completed   HIV Screening  Completed   Zoster Vaccines- Shingrix  Completed   HPV VACCINES  Aged Out    Discussed health benefits of physical activity, and encouraged her to engage in regular exercise appropriate for her age and condition.  Problem List Items Addressed This Visit       Endocrine   Type 2 diabetes mellitus without complication, without long-term current use of insulin (HCC)    Well-controlled on  metformin 500 mg BID. Recent prednisone use may affect glucose levels. A1c needs to be checked to monitor long-term glucose control. A week of prednisone use should not significantly affect the A1c, which reflects a three-month average. - Order A1c test - Continue metformin 500 mg BID      Relevant Medications   rosuvastatin (CRESTOR) 5 MG tablet   Other Relevant Orders   POCT HgB A1C (Completed)   Hyperlipidemia associated with type 2 diabetes mellitus (HCC)    Managed with Crestor 5 mg daily. Refill needed. - Refill Crestor 5 mg daily      Relevant Medications   rosuvastatin (CRESTOR) 5 MG tablet     Musculoskeletal and Integument   Psoriatic arthritis (HCC)    Managed with sulfasalazine. Patient prefers primary care management. Referral to rheumatologist if condition worsens. - Prescribe sulfasalazine 2000 mg daily and diclofenac - Monitor labs every six months - Refer to rheumatologist if condition worsens      Relevant Medications   diclofenac (VOLTAREN) 50 MG EC tablet     Other   Morbid obesity (HCC)    Discussed importance of healthy weight management Discussed diet and exercise       Other Visit Diagnoses     Encounter for annual physical exam    -  Primary   Breast cancer screening by mammogram       Relevant Orders   MM 3D SCREENING MAMMOGRAM BILATERAL BREAST   Family history of breast cancer in female       Relevant Orders   Ambulatory referral to Genetics   Colon cancer screening       Relevant Orders   Cologuard   Immunization due       Relevant Orders   Tdap vaccine greater than or equal to 7yo IM (Completed)           Annual Physical Exam [age]-year-old  with type 2 diabetes, hyperlipidemia, psoriatic arthritis, and morbid obesity. Here for an annual physical exam. Discussed health maintenance and current medications. - Review wellness screenings and labs - Address questions or concerns  Knee Pain (Post-ACL and Meniscus Tear) Improvement  with physical therapy and recent prednisone use. Prednisone provides short-term relief but is not a long-term solution. - Continue physical therapy  General Health Maintenance Up to date on eye exams, shingles, pneumonia, COVID, and flu vaccines. Needs mammogram, colon cancer screening, RSV vaccine, and tetanus booster. Discussed benefits of Arexby for RSV vaccine due to better prevention data. - Order mammogram - Send Cologuard kit for colon cancer screening - Recommend Arexvy for RSV vaccine - Administer TDAP vaccine today - Refer to genetics for breast cancer risk assessment  Breast Cancer Risk Assessment Strong family history of breast cancer. Discussed benefits of genetic testing and potential prophylactic treatments. Explained insurance coverage for prophylactic treatments if genetic testing is positive. - Refer to genetics for breast cancer risk assessment  Follow-up - Follow up in six months for routine check-up and lab monitoring - Message with A1c results.       Return in about 6 months (around 09/25/2023) for chronic disease f/u.     Shirlee Latch, MD

## 2023-03-28 ENCOUNTER — Encounter: Payer: Self-pay | Admitting: Family Medicine

## 2023-03-28 NOTE — Assessment & Plan Note (Signed)
Well-controlled on metformin 500 mg BID. Recent prednisone use may affect glucose levels. A1c needs to be checked to monitor long-term glucose control. A week of prednisone use should not significantly affect the A1c, which reflects a three-month average. - Order A1c test - Continue metformin 500 mg BID

## 2023-03-28 NOTE — Assessment & Plan Note (Signed)
Discussed importance of healthy weight management Discussed diet and exercise  

## 2023-03-28 NOTE — Assessment & Plan Note (Signed)
Managed with Crestor 5 mg daily. Refill needed. - Refill Crestor 5 mg daily

## 2023-03-28 NOTE — Assessment & Plan Note (Signed)
Managed with sulfasalazine. Patient prefers primary care management. Referral to rheumatologist if condition worsens. - Prescribe sulfasalazine 2000 mg daily and diclofenac - Monitor labs every six months - Refer to rheumatologist if condition worsens

## 2023-03-29 ENCOUNTER — Encounter: Payer: BC Managed Care – PPO | Admitting: Family Medicine

## 2023-04-10 ENCOUNTER — Ambulatory Visit: Payer: Self-pay | Admitting: *Deleted

## 2023-04-10 ENCOUNTER — Telehealth: Payer: BC Managed Care – PPO | Admitting: Physician Assistant

## 2023-04-10 DIAGNOSIS — B9689 Other specified bacterial agents as the cause of diseases classified elsewhere: Secondary | ICD-10-CM

## 2023-04-10 DIAGNOSIS — H109 Unspecified conjunctivitis: Secondary | ICD-10-CM

## 2023-04-10 MED ORDER — OFLOXACIN 0.3 % OP SOLN: 1.0000 [drp] | Freq: Four times a day (QID) | OPHTHALMIC | 0 refills | Status: DC

## 2023-04-10 NOTE — Progress Notes (Signed)
Virtual Visit Consent   Ariel Doyle, you are scheduled for a virtual visit with a [facility] provider today. Just as with appointments in the office, your consent must be obtained to participate. Your consent will be active for this visit and any virtual visit you may have with one of our providers in the next 365 days. If you have a MyChart account, a copy of this consent can be sent to you electronically.  As this is a virtual visit, video technology does not allow for your provider to perform a traditional examination. This may limit your provider's ability to fully assess your condition. If your provider identifies any concerns that need to be evaluated in person or the need to arrange testing (such as labs, EKG, etc.), we will make arrangements to do so. Although advances in technology are sophisticated, we cannot ensure that it will always work on either your end or our end. If the connection with a video visit is poor, the visit may have to be switched to a telephone visit. With either a video or telephone visit, we are not always able to ensure that we have a secure connection.  By engaging in this virtual visit, you consent to the provision of healthcare and authorize for your insurance to be billed (if applicable) for the services provided during this visit. Depending on your insurance coverage, you may receive a charge related to this service.  I need to obtain your verbal consent now. Are you willing to proceed with your visit today? Ariel Doyle has provided verbal consent on 04/10/2023 for a virtual visit (video or telephone). [name], PA-C  Date: 04/10/2023 10:45 AM  Virtual Visit via Video Note   I, [name], connected with  Ariel Doyle  ([id number], 1960/10/02) on 04/10/23 at 10:45 AM EST by a video-enabled telemedicine application and verified that I am speaking with the correct person using two identifiers.  Location: Patient: Virtual Visit  Location Patient: Home Provider: Virtual Visit Location Provider: Home Office   I discussed the limitations of evaluation and management by telemedicine and the availability of in person appointments. The patient expressed understanding and agreed to proceed.    History of Present Illness: Ariel Doyle is a 62 y.o. who identifies as a female who was assigned female at birth, and is being seen today for possible pink eye.  HPI: Conjunctivitis  The current episode started yesterday. The onset was sudden. The problem occurs continuously. The problem has been gradually worsening. The problem is mild. Nothing relieves the symptoms. Nothing aggravates the symptoms. Associated symptoms include eye itching, congestion, URI (has been on Augmentin for bronchitis), eye discharge and eye redness. Pertinent negatives include no fever, no decreased vision, no double vision, no photophobia, no ear discharge, no ear pain, no headaches, no rhinorrhea, no sore throat, no neck pain, no neck stiffness and no eye pain. The eye pain is mild. Both (L>R) eyes are affected. The eye pain is not associated with movement. The eyelid exhibits no abnormality.     Problems:  Patient Active Problem List   Diagnosis Date Noted   Type 2 diabetes mellitus without complication, without long-term current use of insulin (HCC) 11/30/2022   Hyperlipidemia associated with type 2 diabetes mellitus (HCC) 11/30/2022   Morbid obesity (HCC) 08/19/2019   Lumbar herniated disc 05/27/2019   Psoriatic arthritis (HCC) 05/21/2019   Chronic pain of left knee 03/30/2015   DDD (degenerative disc disease), lumbosacral 08/13/2014  Allergic rhinitis 06/26/2009    Allergies: No Known Allergies Medications:  Current Outpatient Medications:    ofloxacin (OCUFLOX) 0.3 % ophthalmic solution, Place 1 drop into both eyes 4 (four) times daily. For 5 days, Disp: 5 mL, Rfl: 0   benzonatate (TESSALON) 100 MG capsule, Take 1 capsule (100 mg total) by  mouth every 8 (eight) hours., Disp: 21 capsule, Rfl: 0   Blood Glucose Monitoring Suppl (ONE TOUCH ULTRA 2) w/Device KIT, Use as directed to check blood glucose 1-4 times daily, Disp: 1 kit, Rfl: 0   cetirizine (ZYRTEC) 10 MG tablet, Take 10 mg by mouth daily as needed for allergies., Disp: , Rfl:    diclofenac (VOLTAREN) 50 MG EC tablet, Take 2 tablets (100 mg total) by mouth daily., Disp: 180 tablet, Rfl: 1   diclofenac Sodium (VOLTAREN) 1 % GEL, Apply 2-4 g topically 4 (four) times daily as needed (to affected knee or other painful sites). , Disp: , Rfl:    glucose blood (ONETOUCH ULTRA) test strip, Use as directed to check blood glucose 1-4 times daily, Disp: 100 each, Rfl: 12   lansoprazole (PREVACID) 15 MG capsule, Take 15 mg by mouth daily as needed (acid reflux). , Disp: , Rfl:    Levocetirizine Dihydrochloride (XYZAL ALLERGY 24HR PO), Take by mouth., Disp: , Rfl:    metFORMIN (GLUCOPHAGE) 500 MG tablet, Take 1 tablet (500 mg total) by mouth 2 (two) times daily with a meal., Disp: 60 tablet, Rfl: 3   OneTouch Delica Lancets 33G MISC, Use as directed to check blood glucose 1-4 times daily, Disp: 100 each, Rfl: 3   predniSONE (STERAPRED UNI-PAK 21 TAB) 10 MG (21) TBPK tablet, Take by mouth daily. Take 6 tabs by mouth daily  for 1 days, then 5 tabs for 1 days, then 4 tabs for 1 days, then 3 tabs for 1 days, 2 tabs for 1 days, then 1 tab by mouth daily for 1 days, Disp: 21 tablet, Rfl: 0   rosuvastatin (CRESTOR) 5 MG tablet, Take 1 tablet (5 mg total) by mouth daily., Disp: 90 tablet, Rfl: 1   sulfaSALAzine (AZULFIDINE) 500 MG tablet, Take 4 tablets (2,000 mg total) by mouth every evening., Disp: 360 tablet, Rfl: 1  Observations/Objective: Patient is well-developed, well-nourished in no acute distress.  Resting comfortably at home.  Head is normocephalic, atraumatic.  No labored breathing.  Speech is clear and coherent with logical content.  Patient is alert and oriented at baseline.     Assessment and Plan: 1. Bacterial conjunctivitis of both eyes (Primary) - ofloxacin (OCUFLOX) 0.3 % ophthalmic solution; Place 1 drop into both eyes 4 (four) times daily. For 5 days  Dispense: 5 mL; Refill: 0  - Suspect bacterial conjunctivitis - Ocuflox prescribed - Warm compresses - Good hand hygiene - Seek in person evaluation if symptoms worsen or fail to improve   Follow Up Instructions: I discussed the assessment and treatment plan with the patient. The patient was provided an opportunity to ask questions and all were answered. The patient agreed with the plan and demonstrated an understanding of the instructions.  A copy of instructions were sent to the patient via MyChart unless otherwise noted below.    The patient was advised to call back or seek an in-person evaluation if the symptoms worsen or if the condition fails to improve as anticipated.    [name], PA-C

## 2023-04-10 NOTE — Telephone Encounter (Signed)
  Chief Complaint: eyes swelling , draining possible "pink eye" Symptoms: left eye worse than right. Swelling lids. Redness, drainage clear to yellow.  Frequency: s/p bronchitis Pertinent Negatives: Patient denies fever Disposition: [] ED /[] Urgent Care (no appt availability in office) / [] Appointment(In office/virtual)/ [x]  [facility]/ [] Home Care/ [] Refused Recommended Disposition /[] [facility]/ []  Follow-up with PCP Additional Notes:   [facility] scheduled for today .      Reason for Disposition  [1] SEVERE eyelid swelling (i.e., shut or almost) AND [2] involves both eyes AND [3] itchy  Answer Assessment - Initial Assessment Questions 1. ONSET: "When did the swelling start?" (e.g., minutes, hours, days)     Na  2. LOCATION: "What part of the eyelids is swollen?"     Upper and lower lid left eye worse than right . Eye ball red  3. SEVERITY: "How swollen is it?"     Can see out of eye  4. ITCHING: "Is there any itching?" If Yes, ask: "How much?"   (Scale 1-10; mild, moderate or severe)     Yes itching  5. PAIN: "Is the swelling painful to touch?" If Yes, ask: "How painful is it?"   (Scale 1-10; mild, moderate or severe)     Na  6. FEVER: "Do you have a fever?" If Yes, ask: "What is it, how was it measured, and when did it start?"      no 7. CAUSE: "What do you think is causing the swelling?"     Treated for recent bronchitis  8. RECURRENT SYMPTOM: "Have you had eyelid swelling before?" If Yes, ask: "When was the last time?" "What happened that time?"     Yes 9. OTHER SYMPTOMS: "Do you have any other symptoms?" (e.g., blurred vision, eye discharge, rash, runny nose)     Clear to yellow drainage from eye , swelling , redness  10. PREGNANCY: "Is there any chance you are pregnant?" "When was your last menstrual period?"       na  Protocols used: Eye - Swelling-A-AH

## 2023-04-10 NOTE — Patient Instructions (Signed)
Ariel Doyle, thank you for joining [name], PA-C for today's virtual visit.  While this provider is not your primary care provider (PCP), if your PCP is located in our provider database this encounter information will be shared with them immediately following your visit.   A [facility] MyChart account gives you access to today's visit and all your visits, tests, and labs performed at [facility] " click here if you don't have a  MyChart account or go to [email or web address]  Consent: (Patient) Ariel Doyle provided verbal consent for this virtual visit at the beginning of the encounter.  Current Medications:  Current Outpatient Medications:    ofloxacin (OCUFLOX) 0.3 % ophthalmic solution, Place 1 drop into both eyes 4 (four) times daily. For 5 days, Disp: 5 mL, Rfl: 0   benzonatate (TESSALON) 100 MG capsule, Take 1 capsule (100 mg total) by mouth every 8 (eight) hours., Disp: 21 capsule, Rfl: 0   Blood Glucose Monitoring Suppl (ONE TOUCH ULTRA 2) w/Device KIT, Use as directed to check blood glucose 1-4 times daily, Disp: 1 kit, Rfl: 0   cetirizine (ZYRTEC) 10 MG tablet, Take 10 mg by mouth daily as needed for allergies., Disp: , Rfl:    diclofenac (VOLTAREN) 50 MG EC tablet, Take 2 tablets (100 mg total) by mouth daily., Disp: 180 tablet, Rfl: 1   diclofenac Sodium (VOLTAREN) 1 % GEL, Apply 2-4 g topically 4 (four) times daily as needed (to affected knee or other painful sites). , Disp: , Rfl:    glucose blood (ONETOUCH ULTRA) test strip, Use as directed to check blood glucose 1-4 times daily, Disp: 100 each, Rfl: 12   lansoprazole (PREVACID) 15 MG capsule, Take 15 mg by mouth daily as needed (acid reflux). , Disp: , Rfl:    Levocetirizine Dihydrochloride (XYZAL ALLERGY 24HR PO), Take by mouth., Disp: , Rfl:    metFORMIN (GLUCOPHAGE) 500 MG tablet, Take 1 tablet (500 mg total) by mouth 2 (two) times daily with a meal., Disp: 60 tablet, Rfl:  3   OneTouch Delica Lancets 33G MISC, Use as directed to check blood glucose 1-4 times daily, Disp: 100 each, Rfl: 3   predniSONE (STERAPRED UNI-PAK 21 TAB) 10 MG (21) TBPK tablet, Take by mouth daily. Take 6 tabs by mouth daily  for 1 days, then 5 tabs for 1 days, then 4 tabs for 1 days, then 3 tabs for 1 days, 2 tabs for 1 days, then 1 tab by mouth daily for 1 days, Disp: 21 tablet, Rfl: 0   rosuvastatin (CRESTOR) 5 MG tablet, Take 1 tablet (5 mg total) by mouth daily., Disp: 90 tablet, Rfl: 1   sulfaSALAzine (AZULFIDINE) 500 MG tablet, Take 4 tablets (2,000 mg total) by mouth every evening., Disp: 360 tablet, Rfl: 1   Medications ordered in this encounter:  Meds ordered this encounter  Medications   ofloxacin (OCUFLOX) 0.3 % ophthalmic solution    Sig: Place 1 drop into both eyes 4 (four) times daily. For 5 days    Dispense:  5 mL    Refill:  0    Supervising Provider:   Merrilee Jansky [[id number]]     *If you need refills on other medications prior to your next appointment, please contact your pharmacy*  Follow-Up: Call back or seek an in-person evaluation if the symptoms worsen or if the condition fails to improve as anticipated.  [facility] [phone]  Other Instructions Bacterial Conjunctivitis, Adult Bacterial conjunctivitis  is an infection of the clear membrane that covers the white part of the eye and the inner surface of the eyelid (conjunctiva). When the blood vessels in the conjunctiva become inflamed, the eye becomes red or pink. The eye often feels irritated or itchy. Bacterial conjunctivitis spreads easily from person to person (is contagious). It also spreads easily from one eye to the other eye. What are the causes? This condition is caused by bacteria. You may get the infection if you come into close contact with: A person who is infected with the bacteria. Items that are contaminated with the bacteria, such as a face towel, contact lens solution, or  eye makeup. What increases the risk? You are more likely to develop this condition if: You are exposed to other people who have the infection. You wear contact lenses. You have a sinus infection. You have had a recent eye injury or surgery. You have a weak body defense system (immune system). You have a medical condition that causes dry eyes. What are the signs or symptoms? Symptoms of this condition include: Thick, yellowish discharge from the eye. This may turn into a crust on the eyelid overnight and cause your eyelids to stick together. Tearing or watery eyes. Itchy eyes. Burning feeling in your eyes. Eye redness. Swollen eyelids. Blurred vision. How is this diagnosed? This condition is diagnosed based on your symptoms and medical history. Your health care provider may also take a sample of discharge from your eye to find the cause of your infection. How is this treated? This condition may be treated with: Antibiotic eye drops or ointment to clear the infection more quickly and prevent the spread of infection to others. Antibiotic medicines taken by mouth (orally) to treat infections that do not respond to drops or ointments or that last longer than 10 days. Cool, wet cloths (cool compresses) placed on the eyes. Artificial tears applied 2-6 times a day. Follow these instructions at home: Medicines Take or apply your antibiotic medicine as told by your health care provider. Do not stop using the antibiotic, even if your condition improves, unless directed by your health care provider. Take or apply over-the-counter and prescription medicines only as told by your health care provider. Be very careful to avoid touching the edge of your eyelid with the eye-drop bottle or the ointment tube when you apply medicines to the affected eye. This will keep you from spreading the infection to your other eye or to other people. Managing discomfort Gently wipe away any drainage from your eye  with a warm, wet washcloth or a cotton ball. Apply a clean, cool compress to your eye for 10-20 minutes, 3-4 times a day. General instructions Do not wear contact lenses until the inflammation is gone and your health care provider says it is safe to wear them again. Ask your health care provider how to sterilize or replace your contact lenses before you use them again. Wear glasses until you can resume wearing contact lenses. Avoid wearing eye makeup until the inflammation is gone. Throw away any old eye cosmetics that may be contaminated. Change or wash your pillowcase every day. Do not share towels or washcloths. This may spread the infection. Wash your hands often with soap and water for at least 20 seconds and especially before touching your face or eyes. Use paper towels to dry your hands. Avoid touching or rubbing your eyes. Do not drive or use heavy machinery if your vision is blurred. Contact a health care provider  if: You have a fever. Your symptoms do not get better after 10 days. Get help right away if: You have a fever and your symptoms suddenly get worse. You have severe pain when you move your eye. You have facial pain, redness, or swelling. You have a sudden loss of vision. Summary Bacterial conjunctivitis is an infection of the clear membrane that covers the white part of the eye and the inner surface of the eyelid (conjunctiva). Bacterial conjunctivitis spreads easily from eye to eye and from person to person (is contagious). Wash your hands often with soap and water for at least 20 seconds and especially before touching your face or eyes. Use paper towels to dry your hands. Take or apply your antibiotic medicine as told by your health care provider. Do not stop using the antibiotic even if your condition improves. Contact a health care provider if you have a fever or if your symptoms do not get better after 10 days. Get help right away if you have a sudden loss of  vision. This information is not intended to replace advice given to you by your health care provider. Make sure you discuss any questions you have with your health care provider. Document Revised: 07/14/2020 Document Reviewed: 07/14/2020 Elsevier Patient Education  2024 Elsevier Inc.    If you have been instructed to have an in-person evaluation today at a local Urgent Care facility, please use the link below. It will take you to a list of all of our available [facility], including address, phone number and hours of operation. Please do not delay care.  [facility]  If you or a family member do not have a primary care provider, use the link below to schedule a visit and establish care. When you choose a [facility] primary care physician or advanced practice provider, you gain a long-term partner in health. Find a Primary Care Provider  Learn more about [facility]'s in-office and virtual care options: [facility] - Get Care Now

## 2023-04-28 LAB — COLOGUARD: COLOGUARD: NEGATIVE

## 2023-07-07 ENCOUNTER — Encounter: Payer: Self-pay | Admitting: Family Medicine

## 2023-07-07 DIAGNOSIS — K3189 Other diseases of stomach and duodenum: Secondary | ICD-10-CM

## 2023-07-07 DIAGNOSIS — Z789 Other specified health status: Secondary | ICD-10-CM

## 2023-07-22 MED ORDER — OMEPRAZOLE 20 MG PO CPDR: 20.0000 mg | DELAYED_RELEASE_CAPSULE | Freq: Every day | ORAL | 3 refills | Status: DC

## 2023-07-31 ENCOUNTER — Other Ambulatory Visit: Payer: BC Managed Care – PPO

## 2023-07-31 ENCOUNTER — Encounter: Payer: BC Managed Care – PPO | Admitting: Licensed Clinical Social Worker

## 2023-08-22 ENCOUNTER — Encounter (HOSPITAL_COMMUNITY): Payer: Self-pay

## 2023-08-29 ENCOUNTER — Ambulatory Visit
Admission: RE | Admit: 2023-08-29 | Discharge: 2023-08-29 | Disposition: A | Discharge status: Home / Self Care | Source: Ambulatory Visit | Attending: Family Medicine | Admitting: Family Medicine

## 2023-08-29 DIAGNOSIS — Z1231 Encounter for screening mammogram for malignant neoplasm of breast: Secondary | ICD-10-CM | POA: Insufficient documentation

## 2023-09-03 ENCOUNTER — Ambulatory Visit: Payer: Self-pay | Admitting: Family Medicine

## 2023-09-25 ENCOUNTER — Ambulatory Visit: Payer: Self-pay | Admitting: Family Medicine

## 2023-10-02 ENCOUNTER — Other Ambulatory Visit: Payer: Self-pay | Admitting: Family Medicine

## 2023-10-02 DIAGNOSIS — K3189 Other diseases of stomach and duodenum: Secondary | ICD-10-CM

## 2023-10-04 NOTE — Telephone Encounter (Signed)
 Requested medication (s) are due for refill today:   Yes for both  Requested medication (s) are on the active medication list:   Yes for both  Future visit scheduled:   No.  Had appt 09/25/2023 cancelled by pt.      LOV 03/27/2023   Last ordered: Voltaren  03/27/2023 #180, 1 refill;    Azulfidine  12/10/20224 #360, 1 refill  Unable to refill because labs due for Azulfidine  and 6 month appt was cancelled.      Requested Prescriptions  Pending Prescriptions Disp Refills   diclofenac  (VOLTAREN ) 50 MG EC tablet [Pharmacy Med Name: DICLOFENAC  SOD EC 50 MG TAB] 180 tablet 1    Sig: TAKE 2 TABLETS BY MOUTH DAILY     Analgesics:  NSAIDS Failed - 10/04/2023  2:31 PM      Failed - Manual Review: Labs are only required if the patient has taken medication for more than 8 weeks.      Failed - Valid encounter within last 12 months    Recent Outpatient Visits   None            Passed - Cr in normal range and within 360 days    Creatinine  Date Value Ref Range Status  03/20/2023 0.7 0.5 - 1.1 Final   Creatinine, Ser  Date Value Ref Range Status  11/30/2022 0.79 0.57 - 1.00 mg/dL Final         Passed - HGB in normal range and within 360 days    Hemoglobin  Date Value Ref Range Status  03/20/2023 12.1 12.0 - 16.0 Final  09/28/2014 12.9 11.1 - 15.9 g/dL Final         Passed - PLT in normal range and within 360 days    Platelets  Date Value Ref Range Status  03/20/2023 276 150 - 400 K/uL Final  09/28/2014 300 150 - 379 x10E3/uL Final         Passed - HCT in normal range and within 360 days    HCT  Date Value Ref Range Status  03/20/2023 39 36 - 46 Final   Hematocrit  Date Value Ref Range Status  09/28/2014 39.8 34.0 - 46.6 % Final         Passed - eGFR is 30 or above and within 360 days    GFR calc Af Amer  Date Value Ref Range Status  05/27/2019 >60 >60 mL/min Final   GFR calc non Af Amer  Date Value Ref Range Status  05/27/2019 >60 >60 mL/min Final   eGFR  Date Value  Ref Range Status  03/20/2023 99  Final  11/30/2022 86 >59 mL/min/1.73 Final         Passed - Patient is not pregnant       sulfaSALAzine  (AZULFIDINE ) 500 MG tablet [Pharmacy Med Name: sulfaSALAzine  500 MG TABLET] 360 tablet 1    Sig: TAKE FOUR TABLETS BY MOUTH EVERY EVENING     Gastroenterology:  Inflammatory Bowel Disease (Oral) - sulfasalazine  Failed - 10/04/2023  2:31 PM      Failed - Cr in normal range and within 90 days    Creatinine  Date Value Ref Range Status  03/20/2023 0.7 0.5 - 1.1 Final   Creatinine, Ser  Date Value Ref Range Status  11/30/2022 0.79 0.57 - 1.00 mg/dL Final         Failed - AST in normal range and within 90 days    AST  Date Value Ref Range Status  03/20/2023 30  13 - 35 Final         Failed - ALT in normal range and within 90 days    ALT  Date Value Ref Range Status  03/20/2023 34 7 - 35 U/L Final         Failed - CBC w/Diff completed in the last 3 months      Failed - Valid encounter within last 6 months    Recent Outpatient Visits   None

## 2023-10-05 ENCOUNTER — Telehealth: Payer: Self-pay

## 2023-10-05 NOTE — Telephone Encounter (Signed)
 No answer when called, lvm to callback to see which medication is pt referring too.

## 2023-10-05 NOTE — Telephone Encounter (Signed)
 Copied from CRM [id number]. Topic: Clinical - Medication Question >> Oct 04, 2023  8:32 AM Sanjuana Crutch wrote: Reason for CRM: patient called in regarding a refill she did with another rep, says there was a glitch in our system and the pharmacy told her to contact us  . She wanted to speak to someone from the office to confirm that we received the refill or not . Says it is urgent

## 2023-10-22 ENCOUNTER — Telehealth: Payer: Self-pay | Admitting: Family Medicine

## 2023-10-22 NOTE — Telephone Encounter (Signed)
[facility] faxed refill request for the following medications:  omeprazole (PRILOSEC) 20 MG capsule   Please advise.

## 2023-10-23 ENCOUNTER — Telehealth: Payer: Self-pay | Admitting: Family Medicine

## 2023-10-23 DIAGNOSIS — K3189 Other diseases of stomach and duodenum: Secondary | ICD-10-CM

## 2023-10-23 MED ORDER — OMEPRAZOLE 20 MG PO CPDR: 20.0000 mg | DELAYED_RELEASE_CAPSULE | Freq: Every day | ORAL | 3 refills | Status: DC

## 2023-10-23 NOTE — Telephone Encounter (Signed)
 [facility] is requesting refill omeprazole  (PRILOSEC) 20 MG capsule  Please advise

## 2023-11-08 ENCOUNTER — Ambulatory Visit: Admitting: Family Medicine

## 2023-11-08 ENCOUNTER — Encounter: Payer: Self-pay | Admitting: Family Medicine

## 2023-11-08 VITALS — Ht 67.0 in | Wt 280.0 lb

## 2023-11-08 DIAGNOSIS — M791 Myalgia, unspecified site: Secondary | ICD-10-CM

## 2023-11-08 DIAGNOSIS — E119 Type 2 diabetes mellitus without complications: Secondary | ICD-10-CM | POA: Diagnosis not present

## 2023-11-08 DIAGNOSIS — Z7984 Long term (current) use of oral hypoglycemic drugs: Secondary | ICD-10-CM

## 2023-11-08 DIAGNOSIS — E785 Hyperlipidemia, unspecified: Secondary | ICD-10-CM

## 2023-11-08 DIAGNOSIS — E1169 Type 2 diabetes mellitus with other specified complication: Secondary | ICD-10-CM | POA: Diagnosis not present

## 2023-11-08 DIAGNOSIS — K3189 Other diseases of stomach and duodenum: Secondary | ICD-10-CM

## 2023-11-08 DIAGNOSIS — L4050 Arthropathic psoriasis, unspecified: Secondary | ICD-10-CM

## 2023-11-08 MED ORDER — ATORVASTATIN CALCIUM 20 MG PO TABS: 20.0000 mg | ORAL_TABLET | Freq: Every day | ORAL | 3 refills | Status: AC

## 2023-11-08 MED ORDER — OMEPRAZOLE 20 MG PO CPDR: 20.0000 mg | DELAYED_RELEASE_CAPSULE | Freq: Every day | ORAL | 3 refills | Status: AC

## 2023-11-08 NOTE — Assessment & Plan Note (Signed)
 Hyperlipidemia with prior statin-induced myalgia from rosuvastatin , resolved upon discontinuation. Plan to transition to atorvastatin  after CoQ10 repletion to prevent recurrence of myalgias. - Discontinue rosuvastatin  - Start CoQ10 supplementation, 100-200 mg daily, for two months - Initiate atorvastatin  20 mg daily in September - Recheck cholesterol levels at December physical

## 2023-11-08 NOTE — Assessment & Plan Note (Signed)
 Psoriatic arthritis managed with sulfasalazine . - Continue sulfasalazine 

## 2023-11-08 NOTE — Assessment & Plan Note (Signed)
 Well controlled with recent A1c levels not in the diabetic range, but diabetes persists due to a past A1c of 8.3%. - Perform finger stick for A1c today - Check A1c every six months

## 2023-11-08 NOTE — Assessment & Plan Note (Signed)
 Morbid obesity with challenges in weight management. Discussed impact on knee replacement eligibility and the cycle of weight and knee pain.

## 2023-11-08 NOTE — Progress Notes (Unsigned)
 Established patient visit   Patient: Ariel Doyle   DOB: 14-Mar-1961   63 y.o. Female  MRN: [id number] Visit Date: 11/08/2023  Today's healthcare provider: Jon Eva, MD   Chief Complaint  Patient presents with   Medical Management of Chronic Issues   Diabetes    Pt reports she does not monitor. Reports she uses metformin  to help with weight as it was prescribed by weight loss doctor at [facility]   Hyperlipidemia    Would like to discuss due to body aches. Reports she stopped taking about 4 to 5 days again and the aches have stopped since then   Subjective    Diabetes  Hyperlipidemia   HPI     Diabetes    Additional comments: Pt reports she does not monitor. Reports she uses metformin  to help with weight as it was prescribed by weight loss doctor at [facility]        Hyperlipidemia    Additional comments: Would like to discuss due to body aches. Reports she stopped taking about 4 to 5 days again and the aches have stopped since then      Last edited by Lilian Fitzpatrick, CMA on 11/08/2023  2:46 PM.       Discussed the use of AI scribe software for clinical note transcription with the patient, who gave verbal consent to proceed.  History of Present Illness   Ariel Doyle is a 63 year old female with type 2 diabetes, hyperlipidemia, and morbid obesity who presents for a chronic follow-up visit.  She is on metformin  500 mg twice daily for type 2 diabetes, with a recent A1c of 6.4%, improved from 8.3%. She discontinued rosuvastatin  5 mg daily five days ago due to body aches, which resolved after missing doses. She attributes the myalgias to rosuvastatin . She manages psoriatic arthritis with diclofenac  and sulfasalazine , with recent refills. Knee pain is present, attributed to her weight, and she has been advised to lose weight for knee replacement surgery eligibility. Weight fluctuations are noted, influenced by stress from family matters. She takes  omeprazole  for gastrointestinal issues, with a 90-day supply prescribed.         Medications: Outpatient Medications Prior to Visit  Medication Sig   Blood Glucose Monitoring Suppl (ONE TOUCH ULTRA 2) w/Device KIT Use as directed to check blood glucose 1-4 times daily   diclofenac  (VOLTAREN ) 50 MG EC tablet TAKE 2 TABLETS BY MOUTH DAILY   diclofenac  Sodium (VOLTAREN ) 1 % GEL Apply 2-4 g topically 4 (four) times daily as needed (to affected knee or other painful sites).    glucose blood (ONETOUCH ULTRA) test strip Use as directed to check blood glucose 1-4 times daily   Levocetirizine Dihydrochloride (XYZAL ALLERGY 24HR PO) Take by mouth.   metFORMIN  (GLUCOPHAGE ) 500 MG tablet Take 1 tablet (500 mg total) by mouth 2 (two) times daily with a meal.   OneTouch Delica Lancets 33G MISC Use as directed to check blood glucose 1-4 times daily   sulfaSALAzine  (AZULFIDINE ) 500 MG tablet TAKE FOUR TABLETS BY MOUTH EVERY EVENING   [DISCONTINUED] omeprazole  (PRILOSEC) 20 MG capsule Take 1 capsule (20 mg total) by mouth daily.   [DISCONTINUED] rosuvastatin  (CRESTOR ) 5 MG tablet Take 1 tablet (5 mg total) by mouth daily.   [DISCONTINUED] benzonatate  (TESSALON ) 100 MG capsule Take 1 capsule (100 mg total) by mouth every 8 (eight) hours. (Patient not taking: Reported on 11/08/2023)   [DISCONTINUED] cetirizine (ZYRTEC) 10 MG tablet Take 10  mg by mouth daily as needed for allergies. (Patient not taking: Reported on 11/08/2023)   [DISCONTINUED] lansoprazole (PREVACID) 15 MG capsule Take 15 mg by mouth daily as needed (acid reflux).  (Patient not taking: Reported on 11/08/2023)   [DISCONTINUED] ofloxacin  (OCUFLOX ) 0.3 % ophthalmic solution Place 1 drop into both eyes 4 (four) times daily. For 5 days (Patient not taking: Reported on 11/08/2023)   [DISCONTINUED] predniSONE  (STERAPRED UNI-PAK 21 TAB) 10 MG (21) TBPK tablet Take by mouth daily. Take 6 tabs by mouth daily  for 1 days, then 5 tabs for 1 days, then 4 tabs for  1 days, then 3 tabs for 1 days, 2 tabs for 1 days, then 1 tab by mouth daily for 1 days (Patient not taking: Reported on 11/08/2023)   No facility-administered medications prior to visit.    Review of Systems {Insert previous labs (optional):23779} {See past labs  Heme  Chem  Endocrine  Serology  Results Review (optional):1}   Objective    Ht 5' 7 (1.702 m)   Wt 280 lb (127 kg) Comment: Pt reported  LMP 08/22/2014 Comment: irregular  BMI 43.85 kg/m  {Insert last BP/Wt (optional):23777}{See vitals history (optional):1}  Physical Exam Vitals reviewed.  Constitutional:      General: She is not in acute distress.    Appearance: Normal appearance. She is well-developed. She is not diaphoretic.  HENT:     Head: Normocephalic and atraumatic.  Eyes:     General: No scleral icterus.    Conjunctiva/sclera: Conjunctivae normal.  Neck:     Thyroid: No thyromegaly.  Cardiovascular:     Rate and Rhythm: Normal rate and regular rhythm.     Heart sounds: Normal heart sounds. No murmur heard. Pulmonary:     Effort: Pulmonary effort is normal. No respiratory distress.     Breath sounds: Normal breath sounds. No wheezing, rhonchi or rales.  Musculoskeletal:     Cervical back: Neck supple.     Right lower leg: Edema present.     Left lower leg: Edema present.  Lymphadenopathy:     Cervical: No cervical adenopathy.  Skin:    General: Skin is warm and dry.     Findings: No rash.  Neurological:     Mental Status: She is alert and oriented to person, place, and time. Mental status is at baseline.  Psychiatric:        Mood and Affect: Mood normal.        Behavior: Behavior normal.      No results found for any visits on 11/08/23.  Assessment & Plan     Problem List Items Addressed This Visit       Endocrine   Type 2 diabetes mellitus without complication, without long-term current use of insulin (HCC) - Primary   Well controlled with recent A1c levels not in the diabetic  range, but diabetes persists due to a past A1c of 8.3%. - Perform finger stick for A1c today - Check A1c every six months      Relevant Medications   atorvastatin  (LIPITOR) 20 MG tablet   Other Relevant Orders   POCT HgB A1C   Urine Microalbumin w/creat. ratio   Hyperlipidemia associated with type 2 diabetes mellitus (HCC)   Hyperlipidemia with prior statin-induced myalgia from rosuvastatin , resolved upon discontinuation. Plan to transition to atorvastatin  after CoQ10 repletion to prevent recurrence of myalgias. - Discontinue rosuvastatin  - Start CoQ10 supplementation, 100-200 mg daily, for two months - Initiate atorvastatin  20 mg daily in  September - Recheck cholesterol levels at December physical      Relevant Medications   atorvastatin  (LIPITOR) 20 MG tablet     Musculoskeletal and Integument   Psoriatic arthritis (HCC)   Psoriatic arthritis managed with sulfasalazine . - Continue sulfasalazine          Other   Morbid obesity (HCC)   Morbid obesity with challenges in weight management. Discussed impact on knee replacement eligibility and the cycle of weight and knee pain.      Other Visit Diagnoses       Myalgia due to statin         Stomach irritation       Relevant Medications   omeprazole  (PRILOSEC) 20 MG capsule          Osteoarthritis of knee Osteoarthritis of the knee with persistent pain and swelling, exacerbated by heat. Weight loss is necessary for knee replacement surgery.      Return in about 5 months (around 04/09/2024) for CPE (after 03/28/23).       Jon Eva, MD  [facility] [phone] (phone) [phone] (fax)  [facility]

## 2023-11-09 ENCOUNTER — Ambulatory Visit: Payer: Self-pay | Admitting: Family Medicine

## 2023-11-09 LAB — POCT GLYCOSYLATED HEMOGLOBIN (HGB A1C): Hemoglobin A1C: 7 % — AB (ref 4.0–5.6)

## 2024-03-31 ENCOUNTER — Encounter: Payer: Self-pay | Admitting: Family Medicine

## 2024-04-01 MED ORDER — DICLOFENAC SODIUM 50 MG PO TBEC: 100.0000 mg | DELAYED_RELEASE_TABLET | Freq: Every day | ORAL | 1 refills | Status: AC

## 2024-04-11 ENCOUNTER — Other Ambulatory Visit: Payer: Self-pay | Admitting: Family Medicine

## 2024-04-14 ENCOUNTER — Ambulatory Visit: Admitting: Family Medicine

## 2024-04-14 VITALS — BP 124/76 | HR 92 | Resp 14 | Ht 67.0 in

## 2024-04-14 DIAGNOSIS — Z7984 Long term (current) use of oral hypoglycemic drugs: Secondary | ICD-10-CM | POA: Diagnosis not present

## 2024-04-14 DIAGNOSIS — E1169 Type 2 diabetes mellitus with other specified complication: Secondary | ICD-10-CM | POA: Diagnosis not present

## 2024-04-14 DIAGNOSIS — M5431 Sciatica, right side: Secondary | ICD-10-CM

## 2024-04-14 DIAGNOSIS — E785 Hyperlipidemia, unspecified: Secondary | ICD-10-CM | POA: Diagnosis not present

## 2024-04-14 DIAGNOSIS — L4050 Arthropathic psoriasis, unspecified: Secondary | ICD-10-CM

## 2024-04-14 DIAGNOSIS — E119 Type 2 diabetes mellitus without complications: Secondary | ICD-10-CM

## 2024-04-14 DIAGNOSIS — Z0000 Encounter for general adult medical examination without abnormal findings: Secondary | ICD-10-CM | POA: Diagnosis not present

## 2024-04-14 MED ORDER — SULFASALAZINE 500 MG PO TABS: 2000.0000 mg | ORAL_TABLET | Freq: Every evening | ORAL | 1 refills | Status: AC

## 2024-04-14 MED ORDER — METFORMIN HCL 500 MG PO TABS: 500.0000 mg | ORAL_TABLET | Freq: Two times a day (BID) | ORAL | 3 refills | Status: AC

## 2024-04-14 NOTE — Assessment & Plan Note (Signed)
 Not currently taking her statin Has been taking co-Q10 consistently since her last visit If LDL is still elevated on this check, can resume statin with co-Q10 on board

## 2024-04-14 NOTE — Assessment & Plan Note (Signed)
 Well-controlled with sulfasalazine . No recent outbreaks reported. - Refilled sulfasalazine  prescription - Checked blood counts due to sulfasalazine  use

## 2024-04-14 NOTE — Assessment & Plan Note (Signed)
 Contributing to sciatica and psoriatic arthritis. She is scheduled to resume weight management program in February. - Continue with weight management program in February

## 2024-04-14 NOTE — Assessment & Plan Note (Signed)
 Metformin  prescribed. A1c and other relevant labs to be checked today to monitor diabetes control. - Ordered A1c and other relevant labs - Performed foot exam - Performed urine test for proteinuria - Refilled metformin  prescription

## 2024-04-14 NOTE — Progress Notes (Signed)
 "    Complete physical exam   Patient: Ariel Doyle   DOB: 06-08-1960   63 y.o. Female  MRN: [id number] Visit Date: 04/14/2024  Today's healthcare provider: Jon Eva, MD   Chief Complaint  Patient presents with   Annual Exam    Last physical 03/27/23   Subjective    Ariel Doyle is a 63 y.o. female who presents today for a complete physical exam.    Discussed the use of AI scribe software for clinical note transcription with the patient, who gave verbal consent to proceed.  History of Present Illness   Ariel Doyle is a 63 year old female who presents for an annual physical exam.  She has had right-sided sciatica for two months with pain in the lower hip and upper buttock that does not radiate down the leg. She has no leg weakness or numbness. Pain worsens with activity. A prednisone  taper pack gave significant relief.  Her psoriatic arthritis is controlled on sulfasalazine  four pills daily with no recent flares.  Her type 2 diabetes is managed with metformin  500 mg twice daily without reported problems.  Her hyperlipidemia is treated with atorvastatin  20 mg daily. She uses CoQ10 to reduce statin side effects.  She is also taking diclofenac  with a recent 90-day refill.  She plans to restart a weight loss program in [place] in February, as her moderate obesity worsens her arthritis and sciatica symptoms.  She is up to date on flu, COVID-19, shingles, and pneumonia vaccines.        Last depression screening scores    04/14/2024    3:01 PM 03/27/2023    3:14 PM 11/30/2022    9:16 AM  PHQ 2/9 Scores  PHQ - 2 Score 0 0 0   Last fall risk screening    04/14/2024    3:01 PM  Fall Risk   Falls in the past year? 0  Number falls in past yr: 0  Injury with Fall? 0  Risk for fall due to : No Fall Risks  Follow up Falls evaluation completed        Medications: Show/hide medication list[1]  Review of Systems    Objective    BP  124/76 (BP Location: Left Arm, Patient Position: Sitting, Cuff Size: Large)   Pulse 92   Resp 14   Ht 5' 7 (1.702 m)   LMP 08/22/2014 Comment: irregular  SpO2 95%   BMI 43.85 kg/m    Physical Exam Vitals reviewed.  Constitutional:      General: She is not in acute distress.    Appearance: Normal appearance. She is well-developed. She is not diaphoretic.  HENT:     Head: Normocephalic and atraumatic.     Right Ear: Tympanic membrane, ear canal and external ear normal.     Left Ear: Tympanic membrane, ear canal and external ear normal.     Nose: Nose normal.     Mouth/Throat:     Mouth: Mucous membranes are moist.     Pharynx: Oropharynx is clear. No oropharyngeal exudate.  Eyes:     General: No scleral icterus.    Conjunctiva/sclera: Conjunctivae normal.     Pupils: Pupils are equal, round, and reactive to light.  Neck:     Thyroid: No thyromegaly.  Cardiovascular:     Rate and Rhythm: Normal rate and regular rhythm.     Heart sounds: Normal heart sounds. No murmur heard. Pulmonary:     Effort: Pulmonary effort is  normal. No respiratory distress.     Breath sounds: Normal breath sounds. No wheezing or rales.  Abdominal:     General: There is no distension.     Palpations: Abdomen is soft.     Tenderness: There is no abdominal tenderness.  Musculoskeletal:        General: No deformity.     Cervical back: Neck supple.     Right lower leg: No edema.     Left lower leg: No edema.  Lymphadenopathy:     Cervical: No cervical adenopathy.  Skin:    General: Skin is warm and dry.  Neurological:     Mental Status: She is alert and oriented to person, place, and time. Mental status is at baseline.     Gait: Gait normal.  Psychiatric:        Mood and Affect: Mood normal.        Behavior: Behavior normal.        Thought Content: Thought content normal.      No results found for any visits on 04/14/24.  Assessment & Plan    Routine Health Maintenance and Physical  Exam  Exercise Activities and Dietary recommendations  Goals   None     Immunization History  Administered Date(s) Administered   Influenza, Quadrivalent, Recombinant, Inj, Pf 01/11/2022   Influenza, Seasonal, Injecte, Preservative Fre 02/23/2016, 02/07/2017   Influenza,inj,Quad PF,6+ Mos 01/20/2019   Influenza,inj,quad, With Preservative 01/19/2020   Influenza,trivalent, recombinat, inj, PF 12/29/2022   Influenza-Unspecified 01/16/2018, 01/10/2023, 01/22/2024   PFIZER Comirnaty(Gray Top)Covid-19 Tri-Sucrose Vaccine 07/21/2020   PFIZER(Purple Top)SARS-COV-2 Vaccination 04/21/2019, 05/12/2019, 12/11/2019   Pfizer Covid-19 Vaccine Bivalent Booster 74yrs & up 02/07/2021   Pfizer(Comirnaty)Fall Seasonal Vaccine 12 years and older 01/11/2022, 12/29/2022, 01/15/2024   Pneumococcal Conjugate-13 08/19/2019   Pneumococcal Polysaccharide-23 10/23/2019   Tdap 02/22/2010, 03/27/2023   Zoster Recombinant(Shingrix) 08/19/2019, 10/23/2019    Health Maintenance  Topic Date Due   OPHTHALMOLOGY EXAM  Never done   Cervical Cancer Screening (Pap smear)  08/18/2020   Diabetic kidney evaluation - Urine ACR  11/30/2023   Diabetic kidney evaluation - eGFR measurement  03/19/2024   HEMOGLOBIN A1C  05/11/2024   COVID-19 Vaccine (9 - Pfizer risk 2025-26 season) 07/14/2024   Pneumococcal Vaccine: 50+ Years (3 of 3 - PCV20 or PCV21) 10/22/2024   FOOT EXAM  04/14/2025   Mammogram  08/28/2025   Fecal DNA (Cologuard)  04/22/2026   DTaP/Tdap/Td (3 - Td or Tdap) 03/26/2033   Influenza Vaccine  Completed   Hepatitis C Screening  Completed   HIV Screening  Completed   Zoster Vaccines- Shingrix  Completed   Hepatitis B Vaccines 19-59 Average Risk  Aged Out   HPV VACCINES  Aged Out   Meningococcal B Vaccine  Aged Out    Discussed health benefits of physical activity, and encouraged her to engage in regular exercise appropriate for her age and condition.  Problem List Items Addressed This Visit        Endocrine   Type 2 diabetes mellitus without complication, without long-term current use of insulin (HCC)   Metformin  prescribed. A1c and other relevant labs to be checked today to monitor diabetes control. - Ordered A1c and other relevant labs - Performed foot exam - Performed urine test for proteinuria - Refilled metformin  prescription      Relevant Medications   metFORMIN  (GLUCOPHAGE ) 500 MG tablet   Other Relevant Orders   CBC w/Diff/Platelet   Comprehensive metabolic panel with GFR   Lipid  panel   Hemoglobin A1c   Urine Microalbumin w/creat. ratio   Hyperlipidemia associated with type 2 diabetes mellitus (HCC)   Not currently taking her statin Has been taking co-Q10 consistently since her last visit If LDL is still elevated on this check, can resume statin with co-Q10 on board      Relevant Medications   metFORMIN  (GLUCOPHAGE ) 500 MG tablet   Other Relevant Orders   CBC w/Diff/Platelet   Comprehensive metabolic panel with GFR   Lipid panel   Hemoglobin A1c     Musculoskeletal and Integument   Psoriatic arthritis (HCC)   Well-controlled with sulfasalazine . No recent outbreaks reported. - Refilled sulfasalazine  prescription - Checked blood counts due to sulfasalazine  use        Other   Morbid obesity (HCC)   Contributing to sciatica and psoriatic arthritis. She is scheduled to resume weight management program in February. - Continue with weight management program in February      Relevant Medications   metFORMIN  (GLUCOPHAGE ) 500 MG tablet   Other Relevant Orders   CBC w/Diff/Platelet   Comprehensive metabolic panel with GFR   Lipid panel   Hemoglobin A1c   Other Visit Diagnoses       Encounter for annual physical exam    -  Primary   Relevant Orders   CBC w/Diff/Platelet   Comprehensive metabolic panel with GFR   Lipid panel   Hemoglobin A1c     Sciatica of right side               Sciatica, right side Right-sided sciatica with pain localized to  the lower hip and upper buttock area, not radiating down the leg. Symptoms have been present for two months, exacerbated by activity. Prednisone  taper has provided relief. No imaging required as it is likely inflammatory in nature. - Continue prednisone  taper - Start exercises after prednisone  taper - Use ice after exercises, switch to heat if needed  General Health Maintenance Up to date on most vaccinations including flu, COVID, shingles, and pneumonia. Eye exam completed in May. Mammogram scheduled for May. Cologuard completed in January 2025. Pap smear due but deferred to next visit. - Will schedule Pap smear at next visit - Continue annual eye exams - Continue annual mammograms - Continue Cologuard every three years - Ensure vaccinations are up to date       Return in about 6 months (around 10/13/2024) for chronic disease f/u, with pap.     Ariel Eva, MD  [facility] [phone] (phone) [phone] (fax)  [facility]      [1]  Outpatient Medications Prior to Visit  Medication Sig   atorvastatin  (LIPITOR) 20 MG tablet Take 1 tablet (20 mg total) by mouth daily.   Blood Glucose Monitoring Suppl (ONE TOUCH ULTRA 2) w/Device KIT Use as directed to check blood glucose 1-4 times daily   diclofenac  (VOLTAREN ) 50 MG EC tablet Take 2 tablets (100 mg total) by mouth daily.   diclofenac  Sodium (VOLTAREN ) 1 % GEL Apply 2-4 g topically 4 (four) times daily as needed (to affected knee or other painful sites).    glucose blood (ONETOUCH ULTRA) test strip Use as directed to check blood glucose 1-4 times daily   Levocetirizine Dihydrochloride (XYZAL ALLERGY 24HR PO) Take by mouth.   omeprazole  (PRILOSEC) 20 MG capsule Take 1 capsule (20 mg total) by mouth daily.   OneTouch Delica Lancets 33G MISC Use as directed to check blood glucose 1-4 times  daily   [DISCONTINUED] metFORMIN  (GLUCOPHAGE ) 500 MG tablet Take 1 tablet (500 mg total) by mouth  2 (two) times daily with a meal.   [DISCONTINUED] sulfaSALAzine  (AZULFIDINE ) 500 MG tablet TAKE FOUR TABLETS BY MOUTH EVERY EVENING   No facility-administered medications prior to visit.   "

## 2024-04-15 ENCOUNTER — Ambulatory Visit: Payer: Self-pay | Admitting: Family Medicine

## 2024-04-15 LAB — COMPREHENSIVE METABOLIC PANEL WITH GFR
ALT: 63 IU/L — ABNORMAL HIGH (ref 0–32)
AST: 47 IU/L — ABNORMAL HIGH (ref 0–40)
Albumin: 4.1 g/dL (ref 3.9–4.9)
Alkaline Phosphatase: 103 IU/L (ref 49–135)
BUN/Creatinine Ratio: 15 (ref 12–28)
BUN: 14 mg/dL (ref 8–27)
Bilirubin Total: <0.2 mg/dL (ref 0.0–1.2)
CO2: 28 mmol/L (ref 20–29)
Calcium: 9.7 mg/dL (ref 8.7–10.3)
Chloride: 98 mmol/L (ref 96–106)
Creatinine, Ser: 0.92 mg/dL (ref 0.57–1.00)
Globulin, Total: 2.4 g/dL (ref 1.5–4.5)
Glucose: 162 mg/dL — ABNORMAL HIGH (ref 70–99)
Potassium: 4.8 mmol/L (ref 3.5–5.2)
Sodium: 141 mmol/L (ref 134–144)
Total Protein: 6.5 g/dL (ref 6.0–8.5)
eGFR: 71 mL/min/1.73

## 2024-04-15 LAB — CBC WITH DIFFERENTIAL/PLATELET
Basophils Absolute: 0 x10E3/uL (ref 0.0–0.2)
Basos: 1 %
EOS (ABSOLUTE): 0.2 x10E3/uL (ref 0.0–0.4)
Eos: 2 %
Hematocrit: 39.6 % (ref 34.0–46.6)
Hemoglobin: 12.1 g/dL (ref 11.1–15.9)
Immature Grans (Abs): 0 x10E3/uL (ref 0.0–0.1)
Immature Granulocytes: 0 %
Lymphocytes Absolute: 2.6 x10E3/uL (ref 0.7–3.1)
Lymphs: 31 %
MCH: 25.4 pg — ABNORMAL LOW (ref 26.6–33.0)
MCHC: 30.6 g/dL — ABNORMAL LOW (ref 31.5–35.7)
MCV: 83 fL (ref 79–97)
Monocytes Absolute: 0.7 x10E3/uL (ref 0.1–0.9)
Monocytes: 8 %
Neutrophils Absolute: 5 x10E3/uL (ref 1.4–7.0)
Neutrophils: 57 %
Platelets: 294 x10E3/uL (ref 150–450)
RBC: 4.76 x10E6/uL (ref 3.77–5.28)
RDW: 14.6 % (ref 11.7–15.4)
WBC: 8.5 x10E3/uL (ref 3.4–10.8)

## 2024-04-15 LAB — MICROALBUMIN / CREATININE URINE RATIO
Creatinine, Urine: 96.3 mg/dL
Microalb/Creat Ratio: 8 mg/g{creat} (ref 0–29)
Microalbumin, Urine: 7.7 ug/mL

## 2024-04-15 LAB — LIPID PANEL
Chol/HDL Ratio: 3.3 ratio (ref 0.0–4.4)
Cholesterol, Total: 195 mg/dL (ref 100–199)
HDL: 60 mg/dL
LDL Chol Calc (NIH): 98 mg/dL (ref 0–99)
Triglycerides: 218 mg/dL — ABNORMAL HIGH (ref 0–149)
VLDL Cholesterol Cal: 37 mg/dL (ref 5–40)

## 2024-04-15 LAB — HEMOGLOBIN A1C
Est. average glucose Bld gHb Est-mCnc: 146 mg/dL
Hgb A1c MFr Bld: 6.7 % — ABNORMAL HIGH (ref 4.8–5.6)

## 2024-09-30 ENCOUNTER — Ambulatory Visit: Admitting: Family Medicine
